# Patient Record
Sex: Male | Born: 1935 | Race: Black or African American | Hispanic: No | Smoking: Former smoker
Health system: Southern US, Community
[De-identification: ages and names within clinical notes are randomized; demographics above are authoritative.]

## PROBLEM LIST (undated history)

## (undated) DIAGNOSIS — I69391 Dysphagia following cerebral infarction: Secondary | ICD-10-CM

## (undated) DIAGNOSIS — I6529 Occlusion and stenosis of unspecified carotid artery: Secondary | ICD-10-CM

## (undated) DIAGNOSIS — G459 Transient cerebral ischemic attack, unspecified: Secondary | ICD-10-CM

## (undated) DIAGNOSIS — M4800 Spinal stenosis, site unspecified: Secondary | ICD-10-CM

## (undated) DIAGNOSIS — M1990 Unspecified osteoarthritis, unspecified site: Secondary | ICD-10-CM

## (undated) DIAGNOSIS — I509 Heart failure, unspecified: Secondary | ICD-10-CM

## (undated) DIAGNOSIS — I739 Peripheral vascular disease, unspecified: Secondary | ICD-10-CM

## (undated) DIAGNOSIS — G40909 Epilepsy, unspecified, not intractable, without status epilepticus: Secondary | ICD-10-CM

## (undated) DIAGNOSIS — I10 Essential (primary) hypertension: Secondary | ICD-10-CM

## (undated) DIAGNOSIS — I639 Cerebral infarction, unspecified: Secondary | ICD-10-CM

## (undated) DIAGNOSIS — E785 Hyperlipidemia, unspecified: Secondary | ICD-10-CM

## (undated) DIAGNOSIS — Z950 Presence of cardiac pacemaker: Secondary | ICD-10-CM

## (undated) DIAGNOSIS — I2510 Atherosclerotic heart disease of native coronary artery without angina pectoris: Secondary | ICD-10-CM

## (undated) HISTORY — PX: INSERT / REPLACE / REMOVE PACEMAKER: SUR710

## (undated) HISTORY — DX: Spinal stenosis, site unspecified: M48.00

## (undated) HISTORY — DX: Unspecified osteoarthritis, unspecified site: M19.90

## (undated) HISTORY — DX: Peripheral vascular disease, unspecified: I73.9

## (undated) HISTORY — DX: Cerebral infarction, unspecified: I63.9

## (undated) HISTORY — DX: Transient cerebral ischemic attack, unspecified: G45.9

## (undated) HISTORY — DX: Occlusion and stenosis of unspecified carotid artery: I65.29

## (undated) HISTORY — DX: Epilepsy, unspecified, not intractable, without status epilepticus: G40.909

## (undated) HISTORY — PX: PACEMAKER PLACEMENT: SHX43

## (undated) HISTORY — DX: Hyperlipidemia, unspecified: E78.5

---

## 2007-09-11 ENCOUNTER — Ambulatory Visit (HOSPITAL_COMMUNITY): Admission: RE | Admit: 2007-09-11 | Discharge: 2007-09-11 | Payer: Self-pay | Admitting: Cardiology

## 2010-06-02 ENCOUNTER — Inpatient Hospital Stay (HOSPITAL_COMMUNITY): Admission: AD | Admit: 2010-06-02 | Discharge: 2010-06-07 | Payer: Self-pay | Admitting: Cardiology

## 2010-06-03 ENCOUNTER — Encounter (INDEPENDENT_AMBULATORY_CARE_PROVIDER_SITE_OTHER): Payer: Self-pay | Admitting: Cardiology

## 2011-01-31 ENCOUNTER — Other Ambulatory Visit: Payer: Self-pay | Admitting: Family Medicine

## 2011-03-13 LAB — CARDIAC PANEL(CRET KIN+CKTOT+MB+TROPI)
CK, MB: 2.7 ng/mL (ref 0.3–4.0)
CK, MB: 3.4 ng/mL (ref 0.3–4.0)
CK, MB: 3.4 ng/mL (ref 0.3–4.0)
CK, MB: 3.4 ng/mL (ref 0.3–4.0)
Relative Index: 1.9 (ref 0.0–2.5)
Relative Index: 2 (ref 0.0–2.5)
Relative Index: 2.1 (ref 0.0–2.5)
Relative Index: 2.3 (ref 0.0–2.5)
Total CK: 128 U/L (ref 7–232)
Total CK: 142 U/L (ref 7–232)
Total CK: 167 U/L (ref 7–232)
Total CK: 178 U/L (ref 7–232)
Total CK: 189 U/L (ref 7–232)
Troponin I: 0.01 ng/mL (ref 0.00–0.06)
Troponin I: 0.02 ng/mL (ref 0.00–0.06)

## 2011-03-13 LAB — BRAIN NATRIURETIC PEPTIDE
Pro B Natriuretic peptide (BNP): 317 pg/mL — ABNORMAL HIGH (ref 0.0–100.0)
Pro B Natriuretic peptide (BNP): 681 pg/mL — ABNORMAL HIGH (ref 0.0–100.0)

## 2011-03-13 LAB — BASIC METABOLIC PANEL
BUN: 16 mg/dL (ref 6–23)
BUN: 18 mg/dL (ref 6–23)
BUN: 9 mg/dL (ref 6–23)
Calcium: 8.5 mg/dL (ref 8.4–10.5)
Calcium: 8.5 mg/dL (ref 8.4–10.5)
Calcium: 8.6 mg/dL (ref 8.4–10.5)
Chloride: 102 mEq/L (ref 96–112)
Chloride: 106 mEq/L (ref 96–112)
Creatinine, Ser: 1.11 mg/dL (ref 0.4–1.5)
Creatinine, Ser: 1.13 mg/dL (ref 0.4–1.5)
Creatinine, Ser: 1.34 mg/dL (ref 0.4–1.5)
Creatinine, Ser: 1.35 mg/dL (ref 0.4–1.5)
GFR calc Af Amer: >60 mL/min (ref >60)
GFR calc Af Amer: >60 mL/min (ref >60)
GFR calc non Af Amer: >60 mL/min (ref >60)
GFR calc non Af Amer: >60 mL/min (ref >60)
Glucose, Bld: 102 mg/dL — ABNORMAL HIGH (ref 70–99)
Glucose, Bld: 108 mg/dL — ABNORMAL HIGH (ref 70–99)
Glucose, Bld: 88 mg/dL (ref 70–99)
Potassium: 3.6 mEq/L (ref 3.5–5.1)
Potassium: 4.1 mEq/L (ref 3.5–5.1)
Potassium: 4.1 mEq/L (ref 3.5–5.1)
Sodium: 141 mEq/L (ref 135–145)
Sodium: 142 mEq/L (ref 135–145)

## 2011-03-13 LAB — GLUCOSE, CAPILLARY
Glucose-Capillary: 111 mg/dL — ABNORMAL HIGH (ref 70–99)
Glucose-Capillary: 120 mg/dL — ABNORMAL HIGH (ref 70–99)
Glucose-Capillary: 138 mg/dL — ABNORMAL HIGH (ref 70–99)
Glucose-Capillary: 166 mg/dL — ABNORMAL HIGH (ref 70–99)
Glucose-Capillary: 170 mg/dL — ABNORMAL HIGH (ref 70–99)
Glucose-Capillary: 187 mg/dL — ABNORMAL HIGH (ref 70–99)
Glucose-Capillary: 192 mg/dL — ABNORMAL HIGH (ref 70–99)
Glucose-Capillary: 193 mg/dL — ABNORMAL HIGH (ref 70–99)
Glucose-Capillary: 203 mg/dL — ABNORMAL HIGH (ref 70–99)
Glucose-Capillary: 241 mg/dL — ABNORMAL HIGH (ref 70–99)
Glucose-Capillary: 71 mg/dL (ref 70–99)
Glucose-Capillary: 97 mg/dL (ref 70–99)

## 2011-03-13 LAB — CBC
HCT: 34.2 % — ABNORMAL LOW (ref 39.0–52.0)
HCT: 38.7 % — ABNORMAL LOW (ref 39.0–52.0)
Hemoglobin: 11.1 g/dL — ABNORMAL LOW (ref 13.0–17.0)
Hemoglobin: 12.4 g/dL — ABNORMAL LOW (ref 13.0–17.0)
MCV: 90.9 fL (ref 78.0–100.0)
RBC: 3.74 MIL/uL — ABNORMAL LOW (ref 4.22–5.81)
RBC: 4.25 MIL/uL (ref 4.22–5.81)
RDW: 14.2 % (ref 11.5–15.5)
WBC: 6.1 10*3/uL (ref 4.0–10.5)

## 2011-03-13 LAB — COMPREHENSIVE METABOLIC PANEL
Albumin: 3.3 g/dL — ABNORMAL LOW (ref 3.5–5.2)
Alkaline Phosphatase: 62 U/L (ref 39–117)
Chloride: 108 mEq/L (ref 96–112)
GFR calc Af Amer: >60 mL/min (ref >60)
Sodium: 144 mEq/L (ref 135–145)

## 2011-03-13 LAB — APTT: aPTT: 32 seconds (ref 24–37)

## 2011-03-13 LAB — PROTIME-INR: INR: 1.05 (ref 0.00–1.49)

## 2011-05-09 NOTE — Op Note (Signed)
NAMEDEMPSEY, AHONEN            ACCOUNT NO.:  [id number]   MEDICAL RECORD NO.:  [id number]          PATIENT TYPE:  OIB   LOCATION:  2899                         FACILITY:  [facility]   PHYSICIAN:  Francisca December, M.D.  DATE OF BIRTH:  [birth year]   DATE OF PROCEDURE:  09/11/2007  DATE OF DISCHARGE:                               OPERATIVE REPORT   PROCEDURES PERFORMED:  1. Explant old pacing generator.  2. Implant new dual-chamber permanent transvenous pacemaker.   INDICATIONS:  Mr. [name] is a 77 year old man who is about 3  years status post PTVT for complete heart block.  He has no underlying  rhythm.  His battery has been depleted early because of multiple  functions activated.  His threshold via the device are not on  reasonable.  He is brought to the catheterization laboratory at this  time for battery replacement.  We will use a larger battery device to  improve subsequent longevity.   PROCEDURE NOTE:  The patient was brought to cardiac catheterization  laboratory in the fasting state.  The left prepectoral region was  prepped and draped in the usual sterile fashion.  Local anesthesia was  obtained with infiltration of 1% lidocaine with epinephrine throughout  the left prepectoral pacemaker insert region.  A 7- to 8-cm incision was  then made over the previous incision site, and this was carried down by  sharp dissection and power electrocautery until the pacemaker capsule  was exposed.  The capsule was then incised, and the device was delivered  from the pocket without extensive difficulty.  The leads were then  detached from the pacing generator and the ventricular lead quickly  attached to the external pacer/analyzer.  No R waves were available for  sensing.  Thresholds were obtained as well as impedances.  The patient  was left on VVI backup pacing at 40, and the atrial lead was  interrogated via the analyzer.  The very low-amplitude P-waves were seen  in the  0.25-0.3 millivolt range.  This made threshold testing very  difficult via the analyzer as the device could not sense the P-waves as  the pacing voltage was decreased.  We had to use capture P-wave  morphology.  It did appear that the threshold via the analyzer using  this technique was about 1.5 mV.  The new pacing generator was then  placed on the field and the lead detached as appropriate.  Each lead was  tightened into place and tested for security.  Since the device was a  1/3 larger than his old pacemaker, extensive pocket enlargement was  required, again using sharp dissection and low-power electrocautery.  Finally, after ascertaining the pocket was enlarged adequately, it was  copiously irrigated with 1% kanamycin solution.  The device was then  placed in the pocket and fluoroscopy performed to ensure adequate lead  placement and no kinking.  The pocket was then closed using 2-0 Vicryl  in running fashion for the subcutaneous layer.  The skin was  approximated using 4-0 Vicryl in a running subcuticular fashion.  Steri-  Strips and sterile dressing were applied, and  the patient was  transported to the recovery area in stable condition.   EQUIPMENT DATA:  The explanted device with a [employer] Jude model number 5370,  serial number [id number] at [employer].  Date of implant July 19, 2004.  The new  implanted device was a [employer] Jude Zephyr XL DR model number [id number], serial  number [id number].   PACING DATA:  The atrial lead as mentioned detected a sporadic 0.2-0.3  mV P-wave.  The pacing threshold was 1.5 volts at 0.5 milliseconds pulse  width.  The impedance was 479 ohms resulting in a current at capture  threshold of 3.0 MA.   The ventricular lead had a pacing threshold of 1.2 volts at 0.5  milliseconds in a bipolar configuration.  The impedance was 435 ohms  resulting in a current at capture threshold of 3.0 MA.  In a unipolar  configuration, the pacing threshold was 0.8 volts at 0.5 milliseconds   pulse width.  The impedance was 364 ohms resulting in a current at  capture threshold of 3.4 MA.      Francisca December, M.D.  Electronically Signed     JHE/MEDQ  D:  09/11/2007  T:  09/11/2007  Job:  [id number]

## 2011-07-12 ENCOUNTER — Inpatient Hospital Stay (HOSPITAL_COMMUNITY)
Admission: AD | Admit: 2011-07-12 | Discharge: 2011-07-24 | DRG: 292 | Disposition: A | Discharge status: Skilled Nursing Facility | Payer: Medicare Other | Source: Ambulatory Visit | Attending: Cardiology | Admitting: Cardiology

## 2011-07-12 ENCOUNTER — Inpatient Hospital Stay (HOSPITAL_COMMUNITY): Payer: Medicare Other

## 2011-07-12 DIAGNOSIS — I5023 Acute on chronic systolic (congestive) heart failure: Principal | ICD-10-CM | POA: Diagnosis present

## 2011-07-12 DIAGNOSIS — I059 Rheumatic mitral valve disease, unspecified: Secondary | ICD-10-CM | POA: Diagnosis present

## 2011-07-12 DIAGNOSIS — Z794 Long term (current) use of insulin: Secondary | ICD-10-CM

## 2011-07-12 DIAGNOSIS — I472 Ventricular tachycardia, unspecified: Secondary | ICD-10-CM | POA: Diagnosis not present

## 2011-07-12 DIAGNOSIS — I428 Other cardiomyopathies: Secondary | ICD-10-CM | POA: Diagnosis present

## 2011-07-12 DIAGNOSIS — Z7982 Long term (current) use of aspirin: Secondary | ICD-10-CM

## 2011-07-12 DIAGNOSIS — Z8673 Personal history of transient ischemic attack (TIA), and cerebral infarction without residual deficits: Secondary | ICD-10-CM

## 2011-07-12 DIAGNOSIS — Z79899 Other long term (current) drug therapy: Secondary | ICD-10-CM

## 2011-07-12 DIAGNOSIS — E1165 Type 2 diabetes mellitus with hyperglycemia: Secondary | ICD-10-CM | POA: Diagnosis not present

## 2011-07-12 DIAGNOSIS — E7800 Pure hypercholesterolemia, unspecified: Secondary | ICD-10-CM | POA: Diagnosis present

## 2011-07-12 DIAGNOSIS — R5381 Other malaise: Secondary | ICD-10-CM | POA: Diagnosis present

## 2011-07-12 DIAGNOSIS — I4729 Other ventricular tachycardia: Secondary | ICD-10-CM | POA: Diagnosis not present

## 2011-07-12 DIAGNOSIS — Z950 Presence of cardiac pacemaker: Secondary | ICD-10-CM

## 2011-07-12 DIAGNOSIS — A498 Other bacterial infections of unspecified site: Secondary | ICD-10-CM | POA: Diagnosis not present

## 2011-07-12 DIAGNOSIS — M4800 Spinal stenosis, site unspecified: Secondary | ICD-10-CM | POA: Diagnosis present

## 2011-07-12 DIAGNOSIS — E876 Hypokalemia: Secondary | ICD-10-CM | POA: Diagnosis not present

## 2011-07-12 DIAGNOSIS — E785 Hyperlipidemia, unspecified: Secondary | ICD-10-CM | POA: Diagnosis present

## 2011-07-12 DIAGNOSIS — N390 Urinary tract infection, site not specified: Secondary | ICD-10-CM | POA: Diagnosis not present

## 2011-07-12 DIAGNOSIS — I10 Essential (primary) hypertension: Secondary | ICD-10-CM | POA: Diagnosis present

## 2011-07-12 DIAGNOSIS — E873 Alkalosis: Secondary | ICD-10-CM | POA: Diagnosis not present

## 2011-07-12 DIAGNOSIS — IMO0002 Reserved for concepts with insufficient information to code with codable children: Secondary | ICD-10-CM | POA: Diagnosis not present

## 2011-07-12 LAB — APTT: aPTT: 34 seconds (ref 24–37)

## 2011-07-12 LAB — COMPREHENSIVE METABOLIC PANEL
AST: 42 U/L — ABNORMAL HIGH (ref 0–37)
Albumin: 3.2 g/dL — ABNORMAL LOW (ref 3.5–5.2)
Alkaline Phosphatase: 85 U/L (ref 39–117)
BUN: 17 mg/dL (ref 6–23)
CO2: 31 mEq/L (ref 19–32)
Chloride: 106 mEq/L (ref 96–112)
GFR calc non Af Amer: >60 mL/min (ref >60)
Potassium: 4.1 mEq/L (ref 3.5–5.1)
Total Bilirubin: 0.7 mg/dL (ref 0.3–1.2)

## 2011-07-12 LAB — PRO B NATRIURETIC PEPTIDE: Pro B Natriuretic peptide (BNP): 18152 pg/mL — ABNORMAL HIGH (ref 0–450)

## 2011-07-12 LAB — DIFFERENTIAL
Basophils Absolute: 0 10*3/uL (ref 0.0–0.1)
Eosinophils Absolute: 0.1 10*3/uL (ref 0.0–0.7)
Eosinophils Relative: 2 % (ref 0–5)
Lymphocytes Relative: 26 % (ref 12–46)
Lymphs Abs: 1.6 10*3/uL (ref 0.7–4.0)
Monocytes Absolute: 0.6 10*3/uL (ref 0.1–1.0)

## 2011-07-12 LAB — CBC
HCT: 41.2 % (ref 39.0–52.0)
MCHC: 33 g/dL (ref 30.0–36.0)
MCV: 87.7 fL (ref 78.0–100.0)
Platelets: 201 10*3/uL (ref 150–400)
RDW: 15.2 % (ref 11.5–15.5)
WBC: 6.1 10*3/uL (ref 4.0–10.5)

## 2011-07-13 LAB — BASIC METABOLIC PANEL
BUN: 20 mg/dL (ref 6–23)
BUN: 20 mg/dL (ref 6–23)
CO2: 31 mEq/L (ref 19–32)
Chloride: 105 mEq/L (ref 96–112)
Chloride: 105 mEq/L (ref 96–112)
Creatinine, Ser: 1.11 mg/dL (ref 0.50–1.35)
Glucose, Bld: 128 mg/dL — ABNORMAL HIGH (ref 70–99)
Potassium: 3.7 mEq/L (ref 3.5–5.1)

## 2011-07-13 LAB — GLUCOSE, CAPILLARY
Glucose-Capillary: 136 mg/dL — ABNORMAL HIGH (ref 70–99)
Glucose-Capillary: 125 mg/dL — ABNORMAL HIGH (ref 70–99)

## 2011-07-13 LAB — HEMOGLOBIN A1C: Mean Plasma Glucose: 192 mg/dL — ABNORMAL HIGH (ref <117)

## 2011-07-14 LAB — BASIC METABOLIC PANEL
GFR calc Af Amer: >60 mL/min (ref >60)
GFR calc non Af Amer: >60 mL/min (ref >60)
Potassium: 4.1 mEq/L (ref 3.5–5.1)
Sodium: 142 mEq/L (ref 135–145)

## 2011-07-14 LAB — GLUCOSE, CAPILLARY
Glucose-Capillary: 159 mg/dL — ABNORMAL HIGH (ref 70–99)
Glucose-Capillary: 123 mg/dL — ABNORMAL HIGH (ref 70–99)

## 2011-07-15 DIAGNOSIS — I509 Heart failure, unspecified: Secondary | ICD-10-CM

## 2011-07-15 LAB — BASIC METABOLIC PANEL
BUN: 25 mg/dL — ABNORMAL HIGH (ref 6–23)
Chloride: 97 mEq/L (ref 96–112)
Creatinine, Ser: 1.3 mg/dL (ref 0.50–1.35)
GFR calc Af Amer: >60 mL/min (ref >60)
GFR calc non Af Amer: 53 mL/min — ABNORMAL LOW (ref >60)

## 2011-07-15 LAB — GLUCOSE, CAPILLARY: Glucose-Capillary: 85 mg/dL (ref 70–99)

## 2011-07-16 LAB — BASIC METABOLIC PANEL
CO2: 39 mEq/L — ABNORMAL HIGH (ref 19–32)
Chloride: 94 mEq/L — ABNORMAL LOW (ref 96–112)
Creatinine, Ser: 1.39 mg/dL — ABNORMAL HIGH (ref 0.50–1.35)

## 2011-07-16 LAB — GLUCOSE, CAPILLARY
Glucose-Capillary: 68 mg/dL — ABNORMAL LOW (ref 70–99)
Glucose-Capillary: 179 mg/dL — ABNORMAL HIGH (ref 70–99)
Glucose-Capillary: 218 mg/dL — ABNORMAL HIGH (ref 70–99)
Glucose-Capillary: 183 mg/dL — ABNORMAL HIGH (ref 70–99)
Glucose-Capillary: 228 mg/dL — ABNORMAL HIGH (ref 70–99)

## 2011-07-17 LAB — BASIC METABOLIC PANEL
BUN: 31 mg/dL — ABNORMAL HIGH (ref 6–23)
Calcium: 9.7 mg/dL (ref 8.4–10.5)
Creatinine, Ser: 1.33 mg/dL (ref 0.50–1.35)
GFR calc non Af Amer: 51 mL/min — ABNORMAL LOW (ref >60)
Glucose, Bld: 170 mg/dL — ABNORMAL HIGH (ref 70–99)

## 2011-07-17 LAB — GLUCOSE, CAPILLARY: Glucose-Capillary: 170 mg/dL — ABNORMAL HIGH (ref 70–99)

## 2011-07-18 LAB — GLUCOSE, CAPILLARY
Glucose-Capillary: 180 mg/dL — ABNORMAL HIGH (ref 70–99)
Glucose-Capillary: 200 mg/dL — ABNORMAL HIGH (ref 70–99)
Glucose-Capillary: 153 mg/dL — ABNORMAL HIGH (ref 70–99)

## 2011-07-18 LAB — BASIC METABOLIC PANEL
CO2: 42 mEq/L (ref 19–32)
Chloride: 94 mEq/L — ABNORMAL LOW (ref 96–112)
GFR calc non Af Amer: 43 mL/min — ABNORMAL LOW (ref >60)
Glucose, Bld: 109 mg/dL — ABNORMAL HIGH (ref 70–99)
Potassium: 4.1 mEq/L (ref 3.5–5.1)
Sodium: 141 mEq/L (ref 135–145)

## 2011-07-19 LAB — BASIC METABOLIC PANEL
Calcium: 9.7 mg/dL (ref 8.4–10.5)
GFR calc Af Amer: 53 mL/min — ABNORMAL LOW (ref >60)
GFR calc non Af Amer: 44 mL/min — ABNORMAL LOW (ref >60)
Glucose, Bld: 88 mg/dL (ref 70–99)
Sodium: 138 mEq/L (ref 135–145)

## 2011-07-19 LAB — GLUCOSE, CAPILLARY: Glucose-Capillary: 176 mg/dL — ABNORMAL HIGH (ref 70–99)

## 2011-07-20 ENCOUNTER — Inpatient Hospital Stay (HOSPITAL_COMMUNITY): Payer: Medicare Other

## 2011-07-20 LAB — BASIC METABOLIC PANEL
CO2: 35 mEq/L — ABNORMAL HIGH (ref 19–32)
Chloride: 95 mEq/L — ABNORMAL LOW (ref 96–112)
Sodium: 139 mEq/L (ref 135–145)

## 2011-07-20 LAB — URINALYSIS, MICROSCOPIC ONLY
Glucose, UA: NEGATIVE mg/dL
Ketones, ur: NEGATIVE mg/dL
Nitrite: NEGATIVE
Specific Gravity, Urine: 1.01 (ref 1.005–1.030)
pH: 8 (ref 5.0–8.0)

## 2011-07-20 LAB — PRO B NATRIURETIC PEPTIDE: Pro B Natriuretic peptide (BNP): 2571 pg/mL — ABNORMAL HIGH (ref 0–450)

## 2011-07-20 LAB — CBC
MCH: 29.5 pg (ref 26.0–34.0)
MCHC: 34.4 g/dL (ref 30.0–36.0)
RDW: 14.5 % (ref 11.5–15.5)

## 2011-07-20 LAB — GLUCOSE, CAPILLARY
Glucose-Capillary: 61 mg/dL — ABNORMAL LOW (ref 70–99)
Glucose-Capillary: 162 mg/dL — ABNORMAL HIGH (ref 70–99)
Glucose-Capillary: 215 mg/dL — ABNORMAL HIGH (ref 70–99)
Glucose-Capillary: 298 mg/dL — ABNORMAL HIGH (ref 70–99)

## 2011-07-21 LAB — BASIC METABOLIC PANEL
BUN: 43 mg/dL — ABNORMAL HIGH (ref 6–23)
Calcium: 9.1 mg/dL (ref 8.4–10.5)
GFR calc Af Amer: 48 mL/min — ABNORMAL LOW (ref >60)
GFR calc non Af Amer: 40 mL/min — ABNORMAL LOW (ref >60)
Glucose, Bld: 111 mg/dL — ABNORMAL HIGH (ref 70–99)
Potassium: 4 mEq/L (ref 3.5–5.1)
Sodium: 141 mEq/L (ref 135–145)

## 2011-07-21 LAB — GLUCOSE, CAPILLARY: Glucose-Capillary: 160 mg/dL — ABNORMAL HIGH (ref 70–99)

## 2011-07-21 LAB — CBC
Hemoglobin: 13.6 g/dL (ref 13.0–17.0)
MCH: 28.9 pg (ref 26.0–34.0)
MCHC: 33.9 g/dL (ref 30.0–36.0)
RDW: 14.4 % (ref 11.5–15.5)

## 2011-07-21 LAB — URINE CULTURE: Colony Count: 25000

## 2011-07-22 LAB — CBC
HCT: 38.9 % — ABNORMAL LOW (ref 39.0–52.0)
Hemoglobin: 13.4 g/dL (ref 13.0–17.0)
MCH: 29.1 pg (ref 26.0–34.0)
MCHC: 34.4 g/dL (ref 30.0–36.0)
MCV: 84.6 fL (ref 78.0–100.0)
Platelets: 223 10*3/uL (ref 150–400)
RBC: 4.6 MIL/uL (ref 4.22–5.81)
RDW: 14.2 % (ref 11.5–15.5)
WBC: 9.4 10*3/uL (ref 4.0–10.5)

## 2011-07-22 LAB — BASIC METABOLIC PANEL
BUN: 43 mg/dL — ABNORMAL HIGH (ref 6–23)
CO2: 30 mEq/L (ref 19–32)
Calcium: 8.8 mg/dL (ref 8.4–10.5)
Chloride: 98 mEq/L (ref 96–112)
Creatinine, Ser: 1.46 mg/dL — ABNORMAL HIGH (ref 0.50–1.35)
GFR calc Af Amer: 56 mL/min — ABNORMAL LOW (ref >60)
GFR calc non Af Amer: 46 mL/min — ABNORMAL LOW (ref >60)
Glucose, Bld: 113 mg/dL — ABNORMAL HIGH (ref 70–99)
Potassium: 3.8 mEq/L (ref 3.5–5.1)
Sodium: 137 mEq/L (ref 135–145)

## 2011-07-22 LAB — GLUCOSE, CAPILLARY
Glucose-Capillary: 105 mg/dL — ABNORMAL HIGH (ref 70–99)
Glucose-Capillary: 183 mg/dL — ABNORMAL HIGH (ref 70–99)
Glucose-Capillary: 84 mg/dL (ref 70–99)

## 2011-07-23 LAB — GLUCOSE, CAPILLARY: Glucose-Capillary: 178 mg/dL — ABNORMAL HIGH (ref 70–99)

## 2011-07-24 LAB — URINE CULTURE
Colony Count: >=100000
Culture  Setup Time: 201207281238

## 2011-07-26 LAB — CULTURE, BLOOD (ROUTINE X 2)
Culture  Setup Time: 201207262138
Culture: NO GROWTH

## 2011-07-27 NOTE — Discharge Summary (Signed)
  [name], [name] NO.:  [id number]  MEDICAL RECORD NO.:  [id number]  LOCATION:  3709                         FACILITY:  [facility]  PHYSICIAN:  Armanda Magic, M.D.     DATE OF BIRTH:  10/29/1927  DATE OF ADMISSION:  07/12/2011 DATE OF DISCHARGE:  07/24/2011                              DISCHARGE SUMMARY   ADDENDUM:  The patient was supposed to be discharged on July 21, 2011, but right before he was to go to the skilled nursing facility he spiked a temperature and his transfer was cancelled.  He had blood cultures checked which have been negative up to today but did have a urine which grew out greater than 10 to the 5th E. coli.  He was started on ciprofloxacin 250 mg b.i.d. for 3 days.  He still has 4 doses left.  He defervesced and has not had any further fevers.  On the day of discharge, he was ambulating and IN stable condition without any complaints.  He will be discharged to skilled nursing facility on the medications stated and his initial discharge summary with the addition of Cipro 250 mg b.i.d. for additional 4 doses.     Armanda Magic, M.D.     TT/MEDQ  D:  07/24/2011  T:  07/24/2011  Job:  [id number]  Electronically Signed by Armanda Magic M.D. on 07/27/2011 08:54:21 AM

## 2011-07-31 NOTE — Discharge Summary (Signed)
[name], [name]:  [id number]  MEDICAL RECORD NO.:  [id number]  LOCATION:  3709                         FACILITY:  [facility]  PHYSICIAN:  Jake Bathe, MD      DATE OF BIRTH:  10-06-1927  DATE OF ADMISSION:  07/12/2011 DATE OF DISCHARGE:  anticipated 07/20/2011                        DISCHARGE SUMMARY - REFERRING   FINAL DIAGNOSES: 1. Acute-on-chronic systolic heart failure. 2. Diabetes. 3. Hyperlipidemia. 4. Hypertension. 5. Prior stroke. 6. Status post [employer] Jude dual chamber pacemaker placed in 2008. 7. Deconditioning. 8. Spinal stenosis. 9. Hemorrhoids.  DISCHARGE MEDICATIONS: 1. Spironolactone 25 mg once a day (this is new). 2. Furosemide 40 mg once a day (he was on 40 mg twice a day, however,     his creatinine after an extensive diuresis has increased and is on     discharge is 1.6 up from 1.5 yesterday).  He may in fact need 40 mg     twice a day in the long run. 3. Aspirin 81 mg a day. 4. Carvedilol 25 mg twice a day (on his prior med list he was taking     50 mg twice a day, this has been reduced to 25 mg twice a day). 5. Lipitor 20 mg once a day. 6. Potassium chloride 20 mEq once a day. 7. Stop Benicar, stop Imdur, stop hydralazine all due to hypotension. 8. Lantus 10 units once a day.  DISCHARGE LABORATORY DATA:  On discharge, Pro BNP was 2571, on admit he was 91478.  Sodium 139, potassium 4.1, BUN 38, creatinine 1.62, chloride 35.  On admit, creatinine 0.97 during massive fluid overload. Hemoglobin A1c 8.3.  LFTs, AST was 42, ALT 37, albumin 3.2 on admit. INR 1.21 on admit.  Chest x-ray showed stable cardiomegaly, persistent bilateral pleural effusions on admit.  Echocardiogram June 03, 2010, EF of 35-40% with mild mitral regurgitation.  Telemetry demonstrates paced rhythm.  Early on admission, he had a 91 ventricular tachycardia at approximately 130 beats per minute.  No further recurrences.  Discharge weight 83.6 kg, admission  weight 97.2 kg.  Discharge weight in pounds is 183 pounds, admission weight in pounds was 205 pounds.  BRIEF HOSPITAL COURSE:  An 77 year old male with acute-on-chronic systolic heart failure EF in the 35-40% range was seen in clinic on day of admission with gross edema 4+ bilateral lower extremities, shortness of breath, not being able to take good care of himself at home. Questionable medical compliance at home.  His brother accompanies him to his appointments and does not know if he is fully taking his medications.  His daughter is also critical in his care as well.  He was in his bed for most of the week prior to the appointment not feeling well.  He was admitted and aggressively diuresed first with IV Lasix 80 mg q. 8 hours, then this was transitioned to 80 mg p.o. b.i.d. and successful diuresis took place.  See discharge weight above.  His lower extremity edema has resolved.  Lungs are clear to auscultation bilaterally.  Heart is bradycardic, regular rhythm with occasional ectopy.  He is alert and oriented x3.  Three days prior to discharge, he did state that he had  some heel pain, left heel tender to palpation and ankle after the diuresis, however, this has resolved or improved.  No signs of infection.  No fevers.  He is ambulating with assistance.  Case management has spoken with him and his daughter and he will be placed at a skilled nursing facility.  In regards to his diabetic care, he did have one episode of hypoglycemia while on Lantus 32 units.  This was brought back to 15 units.  He did require some sliding scale so on discharge, I will send him out on 20 units of insulin.  Hemoglobin A1c was 8.3 which was elevated overall.  This will have to be closely monitored.  [name] is his primary physician.  He will continue to monitor this.  His pacemaker was functioning well.  DISCHARGE FOLLOWUP:  He has followup with our clinic in 1 week with [name].  At that  time, a basic metabolic profile will be performed stat in order to check his creatinine.  I have dropped back his Lasix to 40 mg once a day.  He may actually need 40 mg twice a day for maintenance therapy and I want to have daily weights and not to become edematous.  Discharge time in total was 35 minutes.  Med reconciliation, lab work teaching was performed.  As of note, his blood sugar was 61 earlier this morning.  This was on 15 units of Lantus, however, over the past 2 days he has received sliding scale insulin at 3 units on two separate occasions.  I am going to discharge him on 20 units of Lantus, however, he may very well not need this amount in fact after further review, I will go ahead and decrease the Lantus dose to 10 units to ensure that he does not become hypoglycemic.  Further adjustments may need to be made.  ADDENDUM: Just prior to D/C to SNF he had one bout of emesis, complained of body "aches" and his temperature was noted to be 101.8. Because of this, cultures and CXR were preformed and D/C on this day was cancelled.     Jake Bathe, MD     MCS/MEDQ  D:  07/20/2011  T:  07/20/2011  Job:  [id number]  cc:   [name], M.D.  Electronically Signed by Donato Schultz MD on 07/31/2011 07:26:32 AM

## 2011-09-20 ENCOUNTER — Emergency Department (HOSPITAL_COMMUNITY): Payer: Medicare Other

## 2011-09-20 ENCOUNTER — Inpatient Hospital Stay (HOSPITAL_COMMUNITY)
Admission: EM | Admit: 2011-09-20 | Discharge: 2011-09-26 | DRG: 871 | Disposition: A | Discharge status: Skilled Nursing Facility | Payer: Medicare Other | Attending: Internal Medicine | Admitting: Internal Medicine

## 2011-09-20 ENCOUNTER — Encounter (HOSPITAL_COMMUNITY): Payer: Self-pay | Admitting: Radiology

## 2011-09-20 DIAGNOSIS — F0390 Unspecified dementia without behavioral disturbance: Secondary | ICD-10-CM | POA: Diagnosis present

## 2011-09-20 DIAGNOSIS — E876 Hypokalemia: Secondary | ICD-10-CM | POA: Diagnosis not present

## 2011-09-20 DIAGNOSIS — E785 Hyperlipidemia, unspecified: Secondary | ICD-10-CM | POA: Diagnosis present

## 2011-09-20 DIAGNOSIS — Z794 Long term (current) use of insulin: Secondary | ICD-10-CM

## 2011-09-20 DIAGNOSIS — R5381 Other malaise: Secondary | ICD-10-CM | POA: Diagnosis present

## 2011-09-20 DIAGNOSIS — G9349 Other encephalopathy: Secondary | ICD-10-CM | POA: Diagnosis present

## 2011-09-20 DIAGNOSIS — Z950 Presence of cardiac pacemaker: Secondary | ICD-10-CM

## 2011-09-20 DIAGNOSIS — A499 Bacterial infection, unspecified: Secondary | ICD-10-CM | POA: Diagnosis present

## 2011-09-20 DIAGNOSIS — I428 Other cardiomyopathies: Secondary | ICD-10-CM | POA: Diagnosis present

## 2011-09-20 DIAGNOSIS — Z7982 Long term (current) use of aspirin: Secondary | ICD-10-CM

## 2011-09-20 DIAGNOSIS — IMO0001 Reserved for inherently not codable concepts without codable children: Secondary | ICD-10-CM | POA: Diagnosis present

## 2011-09-20 DIAGNOSIS — N179 Acute kidney failure, unspecified: Secondary | ICD-10-CM | POA: Diagnosis present

## 2011-09-20 DIAGNOSIS — N410 Acute prostatitis: Secondary | ICD-10-CM | POA: Diagnosis present

## 2011-09-20 DIAGNOSIS — Z79899 Other long term (current) drug therapy: Secondary | ICD-10-CM

## 2011-09-20 DIAGNOSIS — I509 Heart failure, unspecified: Secondary | ICD-10-CM | POA: Diagnosis present

## 2011-09-20 DIAGNOSIS — N139 Obstructive and reflux uropathy, unspecified: Secondary | ICD-10-CM | POA: Diagnosis present

## 2011-09-20 DIAGNOSIS — D72829 Elevated white blood cell count, unspecified: Secondary | ICD-10-CM | POA: Diagnosis present

## 2011-09-20 DIAGNOSIS — A419 Sepsis, unspecified organism: Secondary | ICD-10-CM

## 2011-09-20 DIAGNOSIS — I5023 Acute on chronic systolic (congestive) heart failure: Secondary | ICD-10-CM | POA: Diagnosis present

## 2011-09-20 DIAGNOSIS — Z8673 Personal history of transient ischemic attack (TIA), and cerebral infarction without residual deficits: Secondary | ICD-10-CM

## 2011-09-20 DIAGNOSIS — L97409 Non-pressure chronic ulcer of unspecified heel and midfoot with unspecified severity: Secondary | ICD-10-CM | POA: Diagnosis present

## 2011-09-20 DIAGNOSIS — N400 Enlarged prostate without lower urinary tract symptoms: Secondary | ICD-10-CM | POA: Diagnosis present

## 2011-09-20 DIAGNOSIS — N3090 Cystitis, unspecified without hematuria: Secondary | ICD-10-CM | POA: Diagnosis present

## 2011-09-20 DIAGNOSIS — I10 Essential (primary) hypertension: Secondary | ICD-10-CM | POA: Diagnosis present

## 2011-09-20 DIAGNOSIS — N3000 Acute cystitis without hematuria: Secondary | ICD-10-CM

## 2011-09-20 DIAGNOSIS — B9689 Other specified bacterial agents as the cause of diseases classified elsewhere: Secondary | ICD-10-CM | POA: Diagnosis present

## 2011-09-20 DIAGNOSIS — A498 Other bacterial infections of unspecified site: Secondary | ICD-10-CM | POA: Diagnosis present

## 2011-09-20 DIAGNOSIS — R6520 Severe sepsis without septic shock: Secondary | ICD-10-CM | POA: Diagnosis present

## 2011-09-20 DIAGNOSIS — D649 Anemia, unspecified: Secondary | ICD-10-CM | POA: Diagnosis present

## 2011-09-20 HISTORY — DX: Atherosclerotic heart disease of native coronary artery without angina pectoris: I25.10

## 2011-09-20 HISTORY — DX: Heart failure, unspecified: I50.9

## 2011-09-20 HISTORY — DX: Essential (primary) hypertension: I10

## 2011-09-20 HISTORY — DX: Presence of cardiac pacemaker: Z95.0

## 2011-09-20 LAB — URINE MICROSCOPIC-ADD ON

## 2011-09-20 LAB — COMPREHENSIVE METABOLIC PANEL
ALT: 22 U/L (ref 0–53)
AST: 31 U/L (ref 0–37)
Albumin: 3.1 g/dL — ABNORMAL LOW (ref 3.5–5.2)
Alkaline Phosphatase: 77 U/L (ref 39–117)
Chloride: 98 mEq/L (ref 96–112)
Potassium: 3.8 mEq/L (ref 3.5–5.1)
Sodium: 139 mEq/L (ref 135–145)
Total Bilirubin: 0.7 mg/dL (ref 0.3–1.2)
Total Protein: 7.6 g/dL (ref 6.0–8.3)

## 2011-09-20 LAB — URINALYSIS, ROUTINE W REFLEX MICROSCOPIC
Glucose, UA: NEGATIVE mg/dL
Ketones, ur: 15 mg/dL — AB
Nitrite: POSITIVE — AB
Specific Gravity, Urine: 1.015 (ref 1.005–1.030)
pH: 6 (ref 5.0–8.0)

## 2011-09-20 LAB — GLUCOSE, CAPILLARY: Glucose-Capillary: 171 mg/dL — ABNORMAL HIGH (ref 70–99)

## 2011-09-20 LAB — DIFFERENTIAL
Basophils Absolute: 0 10*3/uL (ref 0.0–0.1)
Eosinophils Relative: 0 % (ref 0–5)
Lymphocytes Relative: 3 % — ABNORMAL LOW (ref 12–46)
Lymphs Abs: 0.8 10*3/uL (ref 0.7–4.0)
Monocytes Relative: 5 % (ref 3–12)
Neutro Abs: 24.8 10*3/uL — ABNORMAL HIGH (ref 1.7–7.7)

## 2011-09-20 LAB — BLOOD GAS, ARTERIAL
Bicarbonate: 24.5 mEq/L — ABNORMAL HIGH (ref 20.0–24.0)
O2 Saturation: 98.5 %
Patient temperature: 98.6
TCO2: 25.5 mmol/L (ref 0–100)

## 2011-09-20 LAB — CBC
HCT: 37 % — ABNORMAL LOW (ref 39.0–52.0)
Hemoglobin: 12.5 g/dL — ABNORMAL LOW (ref 13.0–17.0)
MCHC: 33.8 g/dL (ref 30.0–36.0)
RBC: 4.32 MIL/uL (ref 4.22–5.81)

## 2011-09-20 LAB — MAGNESIUM: Magnesium: 1.9 mg/dL (ref 1.5–2.5)

## 2011-09-20 LAB — APTT
aPTT: 37 seconds (ref 24–37)
aPTT: 39 seconds — ABNORMAL HIGH (ref 24–37)

## 2011-09-20 LAB — TSH: TSH: 1.962 u[IU]/mL (ref 0.350–4.500)

## 2011-09-20 LAB — CARDIAC PANEL(CRET KIN+CKTOT+MB+TROPI)
CK, MB: 11.3 ng/mL (ref 0.3–4.0)
Relative Index: 3.4 — ABNORMAL HIGH (ref 0.0–2.5)
Total CK: 368 U/L — ABNORMAL HIGH (ref 7–232)
Troponin I: 0.57 ng/mL (ref <0.30)

## 2011-09-20 LAB — BASIC METABOLIC PANEL
CO2: 29 mEq/L (ref 19–32)
Chloride: 100 mEq/L (ref 96–112)
Creatinine, Ser: 2.05 mg/dL — ABNORMAL HIGH (ref 0.50–1.35)

## 2011-09-20 LAB — LIPID PANEL
Cholesterol: 125 mg/dL (ref 0–200)
HDL: 53 mg/dL (ref >39)
Triglycerides: 122 mg/dL (ref <150)
VLDL: 24 mg/dL (ref 0–40)

## 2011-09-20 LAB — PRO B NATRIURETIC PEPTIDE: Pro B Natriuretic peptide (BNP): >70000 pg/mL — ABNORMAL HIGH (ref 0–450)

## 2011-09-20 LAB — POCT I-STAT TROPONIN I: Troponin i, poc: 0.03 ng/mL (ref 0.00–0.08)

## 2011-09-20 LAB — PROCALCITONIN: Procalcitonin: 149.71 ng/mL

## 2011-09-20 LAB — CK TOTAL AND CKMB (NOT AT ARMC): Total CK: 290 U/L — ABNORMAL HIGH (ref 7–232)

## 2011-09-21 DIAGNOSIS — I509 Heart failure, unspecified: Secondary | ICD-10-CM

## 2011-09-21 DIAGNOSIS — A419 Sepsis, unspecified organism: Secondary | ICD-10-CM

## 2011-09-21 DIAGNOSIS — N3000 Acute cystitis without hematuria: Secondary | ICD-10-CM

## 2011-09-21 LAB — CBC
HCT: 31.7 % — ABNORMAL LOW (ref 39.0–52.0)
Hemoglobin: 10.5 g/dL — ABNORMAL LOW (ref 13.0–17.0)
MCH: 28 pg (ref 26.0–34.0)
MCHC: 33.1 g/dL (ref 30.0–36.0)
MCV: 84.5 fL (ref 78.0–100.0)

## 2011-09-21 LAB — TYPE AND SCREEN
ABO/RH(D): A POS
Antibody Screen: NEGATIVE

## 2011-09-21 LAB — BASIC METABOLIC PANEL
BUN: 36 mg/dL — ABNORMAL HIGH (ref 6–23)
CO2: 29 mEq/L (ref 19–32)
Chloride: 103 mEq/L (ref 96–112)
Creatinine, Ser: 2.2 mg/dL — ABNORMAL HIGH (ref 0.50–1.35)
Glucose, Bld: 133 mg/dL — ABNORMAL HIGH (ref 70–99)

## 2011-09-21 LAB — PRO B NATRIURETIC PEPTIDE
Pro B Natriuretic peptide (BNP): >70000 pg/mL — ABNORMAL HIGH (ref 0–450)
Pro B Natriuretic peptide (BNP): >70000 pg/mL — ABNORMAL HIGH (ref 0–450)

## 2011-09-21 LAB — CARDIAC PANEL(CRET KIN+CKTOT+MB+TROPI)
Relative Index: 2.3 (ref 0.0–2.5)
Troponin I: 2.04 ng/mL (ref <0.30)

## 2011-09-21 LAB — GLUCOSE, CAPILLARY: Glucose-Capillary: 171 mg/dL — ABNORMAL HIGH (ref 70–99)

## 2011-09-22 ENCOUNTER — Inpatient Hospital Stay (HOSPITAL_COMMUNITY): Payer: Medicare Other

## 2011-09-22 LAB — GLUCOSE, CAPILLARY
Glucose-Capillary: 152 mg/dL — ABNORMAL HIGH (ref 70–99)
Glucose-Capillary: 115 mg/dL — ABNORMAL HIGH (ref 70–99)
Glucose-Capillary: 98 mg/dL (ref 70–99)
Glucose-Capillary: 167 mg/dL — ABNORMAL HIGH (ref 70–99)
Glucose-Capillary: 189 mg/dL — ABNORMAL HIGH (ref 70–99)

## 2011-09-22 LAB — CORTISOL: Cortisol, Plasma: 20.3 ug/dL

## 2011-09-22 LAB — CARDIAC PANEL(CRET KIN+CKTOT+MB+TROPI)
CK, MB: 3.8 ng/mL (ref 0.3–4.0)
Relative Index: 2 (ref 0.0–2.5)
Total CK: 158 U/L (ref 7–232)
Troponin I: 3.09 ng/mL (ref <0.30)

## 2011-09-22 LAB — BASIC METABOLIC PANEL
BUN: 44 mg/dL — ABNORMAL HIGH (ref 6–23)
CO2: 30 mEq/L (ref 19–32)
Calcium: 8.3 mg/dL — ABNORMAL LOW (ref 8.4–10.5)
Chloride: 101 mEq/L (ref 96–112)
Creatinine, Ser: 2.22 mg/dL — ABNORMAL HIGH (ref 0.50–1.35)
Glucose, Bld: 105 mg/dL — ABNORMAL HIGH (ref 70–99)

## 2011-09-22 LAB — CBC
Hemoglobin: 9.9 g/dL — ABNORMAL LOW (ref 13.0–17.0)
MCH: 28 pg (ref 26.0–34.0)
MCHC: 33.3 g/dL (ref 30.0–36.0)
MCV: 83.9 fL (ref 78.0–100.0)
RBC: 3.54 MIL/uL — ABNORMAL LOW (ref 4.22–5.81)

## 2011-09-22 LAB — URINE CULTURE: Culture  Setup Time: 201209260959

## 2011-09-22 LAB — LACTIC ACID, PLASMA: Lactic Acid, Venous: 1.4 mmol/L (ref 0.5–2.2)

## 2011-09-23 LAB — BASIC METABOLIC PANEL
BUN: 40 mg/dL — ABNORMAL HIGH (ref 6–23)
Chloride: 101 mEq/L (ref 96–112)
Creatinine, Ser: 1.57 mg/dL — ABNORMAL HIGH (ref 0.50–1.35)
GFR calc Af Amer: 51 mL/min — ABNORMAL LOW (ref >60)
GFR calc non Af Amer: 43 mL/min — ABNORMAL LOW (ref >60)
Potassium: 3.3 mEq/L — ABNORMAL LOW (ref 3.5–5.1)

## 2011-09-23 LAB — GLUCOSE, CAPILLARY
Glucose-Capillary: 118 mg/dL — ABNORMAL HIGH (ref 70–99)
Glucose-Capillary: 105 mg/dL — ABNORMAL HIGH (ref 70–99)
Glucose-Capillary: 174 mg/dL — ABNORMAL HIGH (ref 70–99)
Glucose-Capillary: 145 mg/dL — ABNORMAL HIGH (ref 70–99)

## 2011-09-23 LAB — CBC
HCT: 30.8 % — ABNORMAL LOW (ref 39.0–52.0)
MCHC: 33.8 g/dL (ref 30.0–36.0)
Platelets: 182 10*3/uL (ref 150–400)
RDW: 13.8 % (ref 11.5–15.5)
WBC: 14.8 10*3/uL — ABNORMAL HIGH (ref 4.0–10.5)

## 2011-09-23 LAB — CARDIAC PANEL(CRET KIN+CKTOT+MB+TROPI)
CK, MB: 3.1 ng/mL (ref 0.3–4.0)
Total CK: 119 U/L (ref 7–232)
Troponin I: 2.53 ng/mL (ref <0.30)

## 2011-09-24 LAB — GLUCOSE, CAPILLARY
Glucose-Capillary: 217 mg/dL — ABNORMAL HIGH (ref 70–99)
Glucose-Capillary: 167 mg/dL — ABNORMAL HIGH (ref 70–99)
Glucose-Capillary: 217 mg/dL — ABNORMAL HIGH (ref 70–99)

## 2011-09-24 LAB — BASIC METABOLIC PANEL
BUN: 33 mg/dL — ABNORMAL HIGH (ref 6–23)
CO2: 29 mEq/L (ref 19–32)
Chloride: 104 mEq/L (ref 96–112)
Creatinine, Ser: 1.31 mg/dL (ref 0.50–1.35)
Potassium: 4 mEq/L (ref 3.5–5.1)

## 2011-09-24 LAB — CBC
MCHC: 33.1 g/dL (ref 30.0–36.0)
RDW: 13.9 % (ref 11.5–15.5)
WBC: 11.7 10*3/uL — ABNORMAL HIGH (ref 4.0–10.5)

## 2011-09-25 ENCOUNTER — Inpatient Hospital Stay (HOSPITAL_COMMUNITY): Payer: Medicare Other

## 2011-09-25 LAB — BASIC METABOLIC PANEL
BUN: 25 mg/dL — ABNORMAL HIGH (ref 6–23)
Calcium: 8.9 mg/dL (ref 8.4–10.5)
Chloride: 101 mEq/L (ref 96–112)
Creatinine, Ser: 1.16 mg/dL (ref 0.50–1.35)
GFR calc Af Amer: 66 mL/min — ABNORMAL LOW (ref >90)
GFR calc non Af Amer: 57 mL/min — ABNORMAL LOW (ref >90)

## 2011-09-25 LAB — CBC
MCHC: 32.5 g/dL (ref 30.0–36.0)
MCV: 85.1 fL (ref 78.0–100.0)
Platelets: 230 10*3/uL (ref 150–400)
RDW: 13.8 % (ref 11.5–15.5)
WBC: 12.9 10*3/uL — ABNORMAL HIGH (ref 4.0–10.5)

## 2011-09-25 LAB — GLUCOSE, CAPILLARY
Glucose-Capillary: 147 mg/dL — ABNORMAL HIGH (ref 70–99)
Glucose-Capillary: 194 mg/dL — ABNORMAL HIGH (ref 70–99)

## 2011-09-26 LAB — CBC
HCT: 31.5 % — ABNORMAL LOW (ref 39.0–52.0)
Hemoglobin: 10.4 g/dL — ABNORMAL LOW (ref 13.0–17.0)
MCH: 28.1 pg (ref 26.0–34.0)
MCV: 85.1 fL (ref 78.0–100.0)
RBC: 3.7 MIL/uL — ABNORMAL LOW (ref 4.22–5.81)
WBC: 13.1 10*3/uL — ABNORMAL HIGH (ref 4.0–10.5)

## 2011-09-26 LAB — BASIC METABOLIC PANEL
BUN: 20 mg/dL (ref 6–23)
CO2: 31 mEq/L (ref 19–32)
Chloride: 105 mEq/L (ref 96–112)
Creatinine, Ser: 1.11 mg/dL (ref 0.50–1.35)
GFR calc Af Amer: 69 mL/min — ABNORMAL LOW (ref >90)
Glucose, Bld: 135 mg/dL — ABNORMAL HIGH (ref 70–99)
Potassium: 3.9 mEq/L (ref 3.5–5.1)

## 2011-09-26 NOTE — Consult Note (Signed)
  [name], [name]:  [id number]  MEDICAL RECORD NO.:  [id number]  LOCATION:  2608                         FACILITY:  [facility]  PHYSICIAN:  Corky Crafts, MDDATE OF BIRTH:  [birth year]  DATE OF CONSULTATION:  09/20/2011 DATE OF DISCHARGE:                                CONSULTATION   PRIMARY CARDIOLOGIST:  Jake Bathe, MD  REASON FOR CONSULTATION:  Abnormal troponin.  HISTORY OF PRESENT ILLNESS:  The patient is an 77 year old man with known systolic dysfunction.  He was admitted because of abdominal pain. By CT scanning, he was found to have hemorrhagic cystitis. Routine troponins were checked and they were elevated.  His BNP was also elevated.  We are asked to further evaluate.  The patient reports only some mid abdominal pain.  He is not complaining of chest pain or shortness of breath.  The history that we can get from him is somewhat limited due to his mental status.  Some of the history is per the chart.  ALLERGIES:  No known drug allergies.  MEDICATIONS FROM HIS FACILITY:  Percocet, Lipitor, Lantus, Lasix, Coreg, aspirin.  According to our office chart, he was taking spironolactone as a few months before.  SOCIAL HISTORY:  He lives at [facility].  He has a daughter. He does not smoke.  PAST MEDICAL HISTORY:  Systolic dysfunction, hypertension, diabetes, hyperlipidemia, prior stroke.  FAMILY HISTORY:  Noncontributory.  REVIEW OF SYSTEMS:  Significant only for abdominal pain.  Blood in the urine is also noted.  PHYSICAL EXAMINATION:  VITAL SIGNS:  Blood pressure 146/60, heart rate 74. GENERAL:  He is lethargic. HEAD:  Normocephalic, atraumatic. CARDIOVASCULAR:  Irregularly irregular rhythm. LUNGS:  No wheezing. ABDOMEN:  Mildly tender. EXTREMITIES:  Showed trace edema. SKIN:  No rash.  Lab work shows elevated troponin up to 0.86, hemoglobin A1c is 9.1.  TSH 1.9.  BNP elevated.  EKG shows the paced rhythm.  Chest  x-ray shows pulmonary vascular congestion without frank edema.  ASSESSMENT/PLAN:  A 77 year old with multiple medical issues.  PLAN: 1. Cardiac:  Likely in some degree of heart failure.  We will have to     manage his volume status carefully based on his renal function.     His creatinine is somewhat elevated today. 2. Positive troponin.  I do not think there is an ACS.  I would not     heparinize this patient given his active bleeding from his bladder.     He is not a candidate for invasive treatment at this time, would     likely only manage medically. 3. Hemorrhagic cystitis.  He will be receiving antibiotics.  We will     follow along.     Corky Crafts, MD     JSV/MEDQ  D:  09/20/2011  T:  09/20/2011  Job:  [id number]  Electronically Signed by Lance Muss MD on 09/26/2011 01:05:27 PM

## 2011-09-27 LAB — CULTURE, BLOOD (ROUTINE X 2)
Culture  Setup Time: 201209270150
Culture: NO GROWTH
Culture: NO GROWTH

## 2011-10-09 NOTE — Discharge Summary (Signed)
  NAMEJOBANI, [name]            ACCOUNT NO.:  [id number]  MEDICAL RECORD NO.:  [id number]  LOCATION:  5527                         FACILITY:  [facility]  PHYSICIAN:  Thad Ranger, MD       DATE OF BIRTH:  05/12/1927  DATE OF ADMISSION:  09/20/2011 DATE OF DISCHARGE:  09/26/2011                              DISCHARGE SUMMARY   ADDENDUM:  Near the time of discharge, the patient's Foley catheter was removed approximately 3 hours later, and bladder scan was done and he had 381 mL of retained urine.  Consequently, a Foley catheter was inserted and the patient will be discharged to the skilled nursing facility with Foley catheter in place.  He will need to follow up with Dr. Jerilee Field for appropriate management of the Foley catheter given his acute prostatitis and hemorrhagic cystitis.  Dr. Mena Goes will be the one to determine when the Foley catheter should be discontinued.  The patient was seen in consultation by Wound Care during this hospitalization who recommended that the patient have ankle brachial indices testing done as an outpatient.  Consequently, we would suggest his primary care physician that ABI be ordered as an outpatient to evaluate this patient's lower extremity perfusion.     Stephani Police, PA   ______________________________ Thad Ranger, MD    MLY/MEDQ  D:  09/26/2011  T:  09/26/2011  Job:  [id number]  cc:   Jerilee Field, MD  Electronically Signed by Algis Downs PA on 09/30/2011 08:14:44 PM Electronically Signed by Andres Labrum Johara Lodwick  on 10/09/2011 02:16:55 PM

## 2011-10-09 NOTE — Discharge Summary (Signed)
NAMEJAIMES, ECKERT NO.:  [id number]  MEDICAL RECORD NO.:  [id number]  LOCATION:  5527                         FACILITY:  [facility]  PHYSICIAN:  Thad Ranger, MD       DATE OF BIRTH:  04/26/1927  DATE OF ADMISSION:  09/20/2011 DATE OF DISCHARGE:  09/26/2011                              DISCHARGE SUMMARY   PRIMARY CARE PROVIDER:  [facility]  PRIMARY CARDIOLOGIST:  Jake Bathe, MD  UROLOGIST:  Jerilee Field, MD  DISCHARGE DIAGNOSES: 1. Severe sepsis with urinary tract infection and bacteremia. 2. Hemorrhagic cystitis with gram-negative rods. 3. Acute bacterial prostatitis. 4. Acute on chronic congestive heart failure with an LVEF of 20% to     25%. 5. Acute renal failure. 6. Labile hypertension. 7. Left lower extremity ulceration. 8. Diabetes mellitus. 9. Normocytic anemia, stable at baseline.  HISTORY AND BRIEF HOSPITAL COURSE:  Mr. [name] is a very pleasant 77- year-old male with a history of systolic heart failure status post dual- chamber pacemaker placed in 2008 as well as hypertension, hyperlipidemia, and diabetes who presented to [facility] from skilled nursing facility with progressively worsening abdominal pain.  The patient is a poor historian.  Per skilled nursing facility report, the patient was progressively declining, more lethargic, and not at his baseline.  He was admitted to the [facility] with lethargy and encephalopathy as well as severe sepsis secondary to urinary tract infection and hemorrhagic cystitis. He is placed in the step-down unit and critical care consultation was called.  Further Cardiology consultation was called and Urology consultation was called.  1. Hemorrhagic cystitis and acute bacterial prostatitis.  Initially     the patient was placed on vancomycin and Zosyn.  Urology consult     was called and the patient was seen by Dr. Jerilee Field.  The     patient  had a 14-French Foley in place that was draining bloody     urine, at that time Dr. Mena Goes saw him, it was removed and 18-     [place] Foley was placed.  Dr. Mena Goes diagnosed him with benign     prostatic hypertrophy with urinary tract infection and acute     bacterial prostatitis as well as hemorrhagic cystitis.  He     recommended the Foley be continued to monitor urine output that it     would be able to be discontinued when the patient was ambulatory.     He recommended checking a postvoid residuals several hours after     removing the Foley to ensure the patient was not having urinary     retention.  He recommended starting finasteride 5 mg daily and     adding tamsulosin 0.4 mg p.o. to prevent retention and urinary     tract infection in the future.  The patient will follow up with Dr.     Mena Goes at [facility] on October 31 at 10:45.  As the     patient improved, his urine culture returned greater than 100,000     colonies of E. coli that was pansensitive.  The patient will be     discharged on Keflex 500 mg  t.i.d. for 21 days. 2. Acute on chronic congestive heart failure with elevated troponin.     Cardiology consultation was called and Dr. Lance Muss saw     the patient on September 20, 2011.  The patient had a BNP of     greater than 70,000.  His troponin rose to 2.83.  Dr. Eldridge Dace felt     the patient was likely in some degree of heart failure, but his     volume status need to be managed closely as his renal function was     somewhat depressed.  He did not believe the positive troponin was     ACS and recommended not heparinizing the patient given his     hemorrhagic cystitis.  He did not feel that Mr. Vallin was a     candidate for invasive treatment and would manage him medically.     With regard to his congestive heart failure, the patient was     diuresed with IV Lasix.  He has now been returned to his home dose     of Lasix, which is 40 mg p.o. daily  and appears his symptomatic     congestive heart failure has resolved.  Further cardiology was     involved in managing the patient's blood pressure medication as his     blood pressure for the most part remained with systolics in the 110-     135 range, diastolics in the 50-80 range.  However, the patient     would occasionally spike his blood pressure with the systolic in     the 200s and diastolic in the 50s to 80s.  His blood pressure     medications were changed and the patient appears stable at this     point in time.  Further, the patient had a 2-D echocardiogram done     on September 21, 2011, this was done without contrast.  Study     conclusions, there is moderate concentric hypertrophy, systolic     function is severely reduced, estimated ejection fraction was in     the 20% to 25% range with diffuse hypokinesis.  Doppler parameters     are consistent with high ventricular filling pressure.  The patient     will be discharged back to a skilled nursing facility on his     aspirin, Coreg, and previous home dose of Lasix as well as 5 mg of     p.o. lisinopril daily. 3. Acute renal failure.  On admission, the patient was noted to have     an elevated creatinine of 2.2 with close monitoring of his volume     status and dosing of Lasix, his creatinine is 1.11 today, back to     his normal baseline. 4. Left lower extremity ulcer.  The patient had a Wound Care consult     for his left lower extremity ulceration.  The Wound Care nurse     documented an unstagable wound that was present on admission 3 x 5     cm in size to the inner heel.  He had 100% dry eschar intact.  His     outer heel of the unstagable wound 1.5 cm.  Plan, apply Santyl to     the left outer heel to gently debride nonviable tissue.  The     patient will have his heels floated to reduce pressure.  He would     also benefit from ankle-brachial indices to assess  perfusion.     Consequently, we recommend the patient  have ABIs done as an     outpatient. 5. Diabetes mellitus.  The patient's blood sugars during this     hospitalization remained between 135 and 250.  His hemoglobin A1c     indicated that his blood sugars were uncontrolled, it was 9.1 on     September 20, 2011.  The patient will be discharged back to the     skilled nursing facility on 10 units of Lantus subcutaneously at     bedtime.  We would expect his CBGs to improve as his infection     clears.  We would recommend the primary care physician at the     skilled nursing facility monitor and adjust his diabetic     medications accordingly.  PHYSICAL EXAMINATION:  GENERAL:  The patient is currently alert and oriented in no apparent distress.  He is lying in bed. VITAL SIGNS:  Temperature 99.5, pulse 57, respirations 20, blood pressure 95/57, O2 sats 100% on room air. HEENT:  Head is atraumatic, normocephalic.  Eyes are anicteric.  Pupils are equal, round, and reactive to light.  No nasal discharge or exterior lesions.  Mouth has moist mucous membranes with moderate dentition. NECK:  Supple with midline trachea.  He does have an obvious jugular venous pulsation. CHEST:  Demonstrates no accessory muscle use.  He has no wheezes or crackles to my auscultation. HEART:  Regular rate and rhythm without obvious murmurs, rubs, gallops. ABDOMEN:  Has very active bowel sounds.  Soft, nontender, nondistended. EXTREMITIES:  No clubbing, cyanosis, or edema.  He does have his bandages on the distal portion of his right lower extremity covering both inner heel and outer heel ulcerations. PSYCHIATRIC:  Patient is alert and oriented.  Demeanor is pleasant, cooperative.  Grooming is moderate.  PERTINENT LABS:  This hospitalization, on October 2, the patient's WBC is 13.1, hemoglobin 10.4, hematocrit 31.5, platelets 256, glucose 135. Sodium 142, potassium 3.9, chloride 105, bicarb 31, glucose 135, BUN 20, creatinine 1.11, calcium 8.8.  As mentioned,  the patient had a BNP of greater than 70,000.  This was on September 21, 2011.  His cardiac enzymes were cycled.  He did have elevated troponins that peaked at 2.83.  Cortisol level was tested during his hospitalization on September 22, 2011 and found to be within normal limits at 20.3.  MICROBIOLOGY:  Blood cultures x2 were drawn at the time of admission. They show no growth to date.  This is still in preliminary status. Urine culture drawn on September 20, 2011 grew out greater than 100,000 colonies of E. coli pansensitive.  RADIOLOGICAL EXAM:  The patient had a two-view x-ray of his chest that showed cardiomegaly with pulmonary vascular congestion.  No frank interstitial edema.  He has CT of his abdomen and pelvis done on September 20, 2011 that showed a thick-walled bladder with probable intraluminal hemorrhage and gas suggesting hemorrhagic emphysematous cystitis.  Followup chest x-ray was done on September 22, 2011 that shows cardiomegaly without edema and x-rays done to assess for osteomyelitis was done to his left foot that showed no acute finding. There are calcaneal spurs, no evidence of fracture or osteomyelitis.  DISCHARGE MEDICATIONS: 1. Carvedilol 12.5 mg 1 tablet by mouth twice daily with meals. 2. Santyl ointment one application topically daily to inner and outer     ulcerations on his left lower extremity. 3. Finasteride 5 mg 1 tablet by mouth daily. 4. Lisinopril 5 mg 1  tablet daily by mouth. 5. Senna 2 tablets daily by mouth as needed for constipation. 6. Tamsulosin 0.4 mg 1 pill daily by mouth. 7. Keflex 500 mg 1 tablet by mouth three times a day. 8. Aspirin 81 mg 1 tablet by mouth daily. 9. Frusemide 40 mg 1 tablet by mouth daily. 10.ICAP OTC 1 tablet by mouth daily. 11.Lantus 10 units subcutaneously daily at bedtime. 12.Lipitor 20 mg 1 tablet by mouth daily at bedtime. 13.Percocet 5/225 two tablets by mouth every 4 hours as needed for     pain.  DISCHARGE  INSTRUCTIONS:  The patient will be discharged to skilled nursing facility that will be low-sodium heart-healthy  WOUND CARE:  The patient will receive Santyl ointment applied to both inner and outer ulcerations daily.  He needs followup with wound care RN.  His heels need to be floated while he is in bed.  FOLLOWUP APPOINTMENTS:  He will see Dr. Jerilee Field of [facility] October 25, 2011 at 10:45 a.m.  He will also see his primary care physician at the skilled nursing facility.  SUGGESTIONS FOR OUTPATIENT FOLLOWUP: 1. The patient's diabetes is currently uncontrolled.  We would     recommend that the skilled nursing facility physician monitor and     adjust his diabetic medications accordingly. 2. The patient did have acute renal failure in the hospital, which has     resolved; however, he is being discharged on lisinopril and Lasix.     Consequently, we would suggest the primary care physician at    skilled nursing facility that his BUN and creatinine be checked. 3. The patient would benefit from having Ankle Brachial Indices checked as an outpatient.       Stephani Police, PA   ______________________________ Thad Ranger, MD    MLY/MEDQ  D:  09/26/2011  T:  09/26/2011  Job:  [id number]  cc:   [facility] Jerilee Field, MD Jake Bathe, MD  Electronically Signed by Algis Downs PA on 09/30/2011 08:17:07 PM Electronically Signed by Andres Labrum Neythan Kozlov  on 10/09/2011 02:17:01 PM

## 2011-10-11 NOTE — Consult Note (Signed)
[name], GRIVAS NO.:  [id number]  MEDICAL RECORD NO.:  [id number]  LOCATION:  2608                         FACILITY:  [facility]  PHYSICIAN:  Jerilee Field, MD   DATE OF BIRTH:  [birth year]  DATE OF CONSULTATION: DATE OF DISCHARGE:                                CONSULTATION   Consult was requested by Dr. Lenise Arena for urinary tract infection and hemorrhagic cystitis.  HISTORY OF PRESENT ILLNESS:  [name] is an 77 year old male who lives in a nursing facility.  He has had 2-day history of lethargy and suprapubic pain.  The patient has been admitted to the hospital and given antibiotics.  He is feeling much better now.  He is actually alert and oriented x3 and can give a good history.  He denies any prior GU history or surgery.  He denies any GU medication.  He does note a several-day history of increasing dysuria, frequency, urgency, and hematuria.  Of note, he did have a urinary tract infection when he was in the hospital in July of this year.  The patient has also had suprapubic pain and discomfort.  PAST MEDICAL HISTORY:  Significant for: 1. Systolic heart failure with dual-chamber pacemaker placement. 2. Hypertension. 3. Hyperlipidemia. 4. Diabetes.  SOCIAL HISTORY:  The patient lives as a resident at [facility] and is a full code.  ALLERGIES:  No known drug allergies.  CURRENT MEDICATIONS:  Tylenol, Ventolin, Rocephin, Lasix, heparin, insulin.  REVIEW OF SYSTEMS:  Ten-system review of systems obtained.  The patient complained of some chest pain and shortness of breath.  All other review of systems negative except as per HPI.  PHYSICAL EXAMINATION:  VITAL SIGNS:  The patient's temperature is 98.2, respirations 31, heart rate 84, sating 100% on nasal cannula with blood pressure 134/59.  His urine output is 500 mL of bloody urine in the Foley bag, but there are no clots. GENERAL:  On exam, he is in no acute  distress. NEUROLOGIC:  He is awake, alert and oriented. HEENT:  Atraumatic.  Pupils equal, reactive to light. CARDIOVASCULAR:  Distant heart sounds and irregular rate. LUNGS:  Normal respiratory effort in depth. ABDOMEN:  Soft, nontender, nondistended apart from the suprapubic area which is tender to palpation, although this area is soft and his bladder is not so distended. UROLOGIC:  Penis is normal.  The testicles are normal without masses. There is a 14-French Foley in place draining bloody urine that again has no clots.  Digital rectal exam was performed that showed an enlarged prostate that was smooth without any hard areas or nodules. EXTREMITIES:  Some pitting edema and a heel ulcer on his left heel.  I tried to irrigate his Foley catheter, but the 14-French was too small to adequately irrigate the hematuria and it was difficult to irrigate. Therefore, it was removed and an 18-French Foley was placed in the usual sterile fashion without difficulty.  The balloon was inflated and seated at the bladder neck.  The bladder was then irrigated to clear with a liter of water without difficulty.  LABORATORY DATA:  White count is 26, hemoglobin 12, hematocrit 37. Potassium 3.8, BUN 21, creatinine 1.87.  Alk phos is  normal.  Urinalysis showed many bacteria with too-numerous-to-count white cells and red cells.  RADIOLOGIC DATA:  CT scan was obtained.  I did review the images, showed no stone or hydronephrosis of either kidney or ureter.  The bladder was thick walled and full of urine with high density, light quality with some air in the bladder.  IMPRESSION: 1. Benign prostatic hypertrophy, urinary tract infection/acute     bacterial prostatitis. 2. Hemorrhagic cystitis. 3. Congestive heart failure. 4. Acute renal failure.  PLAN:  Continue the Foley for now to monitor urine output.  The Foley can be discontinued when the patient is ambulatory and stable and ready to try to urinate  on his own.  I would check a postvoid residual several hours after removing the Foley to ensure the patient is not in urinary retention.  Also, when the patient is taking p.o., I would start finasteride 5 mg daily and if stable, we would also add tamsulosin 0.4 mg p.o. daily to help prevent retention and UTI in the future.  I did discuss these medicines with the patient and he agrees to start.          ______________________________ Jerilee Field, MD     ME/MEDQ  D:  09/20/2011  T:  09/20/2011  Job:  [id number]  Electronically Signed by Jerilee Field MD on 10/11/2011 01:04:19 PM

## 2011-10-19 ENCOUNTER — Emergency Department (HOSPITAL_COMMUNITY): Payer: Medicare Other

## 2011-10-19 ENCOUNTER — Inpatient Hospital Stay (HOSPITAL_COMMUNITY)
Admission: EM | Admit: 2011-10-19 | Discharge: 2011-10-23 | Disposition: A | Discharge status: Skilled Nursing Facility | Payer: Medicare Other | Source: Home / Self Care | Attending: Internal Medicine | Admitting: Internal Medicine

## 2011-10-19 DIAGNOSIS — E785 Hyperlipidemia, unspecified: Secondary | ICD-10-CM | POA: Diagnosis present

## 2011-10-19 DIAGNOSIS — Z7902 Long term (current) use of antithrombotics/antiplatelets: Secondary | ICD-10-CM

## 2011-10-19 DIAGNOSIS — I739 Peripheral vascular disease, unspecified: Secondary | ICD-10-CM | POA: Diagnosis present

## 2011-10-19 DIAGNOSIS — A498 Other bacterial infections of unspecified site: Secondary | ICD-10-CM | POA: Diagnosis present

## 2011-10-19 DIAGNOSIS — IMO0002 Reserved for concepts with insufficient information to code with codable children: Secondary | ICD-10-CM | POA: Diagnosis present

## 2011-10-19 DIAGNOSIS — E1169 Type 2 diabetes mellitus with other specified complication: Secondary | ICD-10-CM | POA: Diagnosis present

## 2011-10-19 DIAGNOSIS — N183 Chronic kidney disease, stage 3 unspecified: Secondary | ICD-10-CM | POA: Diagnosis present

## 2011-10-19 DIAGNOSIS — Z79899 Other long term (current) drug therapy: Secondary | ICD-10-CM

## 2011-10-19 DIAGNOSIS — I69959 Hemiplegia and hemiparesis following unspecified cerebrovascular disease affecting unspecified side: Secondary | ICD-10-CM

## 2011-10-19 DIAGNOSIS — I509 Heart failure, unspecified: Secondary | ICD-10-CM | POA: Diagnosis present

## 2011-10-19 DIAGNOSIS — Z794 Long term (current) use of insulin: Secondary | ICD-10-CM

## 2011-10-19 DIAGNOSIS — I2510 Atherosclerotic heart disease of native coronary artery without angina pectoris: Secondary | ICD-10-CM | POA: Diagnosis present

## 2011-10-19 DIAGNOSIS — E860 Dehydration: Secondary | ICD-10-CM | POA: Diagnosis present

## 2011-10-19 DIAGNOSIS — G459 Transient cerebral ischemic attack, unspecified: Secondary | ICD-10-CM | POA: Diagnosis present

## 2011-10-19 DIAGNOSIS — L8995 Pressure ulcer of unspecified site, unstageable: Secondary | ICD-10-CM | POA: Diagnosis present

## 2011-10-19 DIAGNOSIS — I10 Essential (primary) hypertension: Secondary | ICD-10-CM | POA: Diagnosis present

## 2011-10-19 DIAGNOSIS — I5022 Chronic systolic (congestive) heart failure: Secondary | ICD-10-CM | POA: Diagnosis present

## 2011-10-19 DIAGNOSIS — Z7982 Long term (current) use of aspirin: Secondary | ICD-10-CM

## 2011-10-19 DIAGNOSIS — E119 Type 2 diabetes mellitus without complications: Secondary | ICD-10-CM | POA: Diagnosis present

## 2011-10-19 DIAGNOSIS — I129 Hypertensive chronic kidney disease with stage 1 through stage 4 chronic kidney disease, or unspecified chronic kidney disease: Secondary | ICD-10-CM | POA: Diagnosis present

## 2011-10-19 DIAGNOSIS — E1165 Type 2 diabetes mellitus with hyperglycemia: Secondary | ICD-10-CM | POA: Diagnosis present

## 2011-10-19 DIAGNOSIS — Z950 Presence of cardiac pacemaker: Secondary | ICD-10-CM

## 2011-10-19 DIAGNOSIS — R5381 Other malaise: Secondary | ICD-10-CM | POA: Diagnosis present

## 2011-10-19 DIAGNOSIS — N390 Urinary tract infection, site not specified: Secondary | ICD-10-CM | POA: Diagnosis present

## 2011-10-19 DIAGNOSIS — G929 Unspecified toxic encephalopathy: Principal | ICD-10-CM | POA: Diagnosis present

## 2011-10-19 DIAGNOSIS — I70269 Atherosclerosis of native arteries of extremities with gangrene, unspecified extremity: Secondary | ICD-10-CM | POA: Diagnosis present

## 2011-10-19 DIAGNOSIS — R64 Cachexia: Secondary | ICD-10-CM | POA: Diagnosis present

## 2011-10-19 DIAGNOSIS — L97809 Non-pressure chronic ulcer of other part of unspecified lower leg with unspecified severity: Secondary | ICD-10-CM | POA: Diagnosis present

## 2011-10-19 DIAGNOSIS — L89609 Pressure ulcer of unspecified heel, unspecified stage: Secondary | ICD-10-CM | POA: Diagnosis present

## 2011-10-19 DIAGNOSIS — E1159 Type 2 diabetes mellitus with other circulatory complications: Secondary | ICD-10-CM | POA: Diagnosis present

## 2011-10-19 DIAGNOSIS — G92 Toxic encephalopathy: Principal | ICD-10-CM | POA: Diagnosis present

## 2011-10-19 LAB — URINE MICROSCOPIC-ADD ON

## 2011-10-19 LAB — COMPREHENSIVE METABOLIC PANEL
ALT: 32 U/L (ref 0–53)
Alkaline Phosphatase: 67 U/L (ref 39–117)
CO2: 29 mEq/L (ref 19–32)
Calcium: 9.4 mg/dL (ref 8.4–10.5)
GFR calc Af Amer: 71 mL/min — ABNORMAL LOW (ref >90)
GFR calc non Af Amer: 61 mL/min — ABNORMAL LOW (ref >90)
Glucose, Bld: 171 mg/dL — ABNORMAL HIGH (ref 70–99)
Potassium: 4.9 mEq/L (ref 3.5–5.1)
Sodium: 140 mEq/L (ref 135–145)

## 2011-10-19 LAB — URINALYSIS, ROUTINE W REFLEX MICROSCOPIC
Hgb urine dipstick: NEGATIVE
Nitrite: NEGATIVE
Protein, ur: NEGATIVE mg/dL
Specific Gravity, Urine: 1.02 (ref 1.005–1.030)
Urobilinogen, UA: 1 mg/dL (ref 0.0–1.0)

## 2011-10-19 LAB — CBC
HCT: 31.9 % — ABNORMAL LOW (ref 39.0–52.0)
Hemoglobin: 10.4 g/dL — ABNORMAL LOW (ref 13.0–17.0)
MCHC: 32.6 g/dL (ref 30.0–36.0)
MCV: 86 fL (ref 78.0–100.0)
RDW: 14.9 % (ref 11.5–15.5)
WBC: 15.7 10*3/uL — ABNORMAL HIGH (ref 4.0–10.5)

## 2011-10-19 LAB — GLUCOSE, CAPILLARY: Glucose-Capillary: 171 mg/dL — ABNORMAL HIGH (ref 70–99)

## 2011-10-20 ENCOUNTER — Inpatient Hospital Stay (HOSPITAL_COMMUNITY): Payer: Medicare Other

## 2011-10-20 DIAGNOSIS — R29810 Facial weakness: Secondary | ICD-10-CM

## 2011-10-20 DIAGNOSIS — R4789 Other speech disturbances: Secondary | ICD-10-CM

## 2011-10-20 LAB — BASIC METABOLIC PANEL
BUN: 29 mg/dL — ABNORMAL HIGH (ref 6–23)
Calcium: 9.1 mg/dL (ref 8.4–10.5)
Chloride: 104 mEq/L (ref 96–112)
Creatinine, Ser: 0.91 mg/dL (ref 0.50–1.35)
GFR calc Af Amer: 89 mL/min — ABNORMAL LOW (ref >90)
GFR calc non Af Amer: 77 mL/min — ABNORMAL LOW (ref >90)

## 2011-10-20 LAB — CBC
Hemoglobin: 9.6 g/dL — ABNORMAL LOW (ref 13.0–17.0)
Platelets: 273 10*3/uL (ref 150–400)
RBC: 3.46 MIL/uL — ABNORMAL LOW (ref 4.22–5.81)
RDW: 14.8 % (ref 11.5–15.5)
WBC: 13 10*3/uL — ABNORMAL HIGH (ref 4.0–10.5)

## 2011-10-20 LAB — GLUCOSE, CAPILLARY
Glucose-Capillary: 106 mg/dL — ABNORMAL HIGH (ref 70–99)
Glucose-Capillary: 129 mg/dL — ABNORMAL HIGH (ref 70–99)

## 2011-10-20 LAB — URINE CULTURE

## 2011-10-20 LAB — LIPID PANEL
Cholesterol: 97 mg/dL (ref 0–200)
Total CHOL/HDL Ratio: 2.6 RATIO

## 2011-10-20 LAB — HEMOGLOBIN A1C
Hgb A1c MFr Bld: 7.6 % — ABNORMAL HIGH (ref <5.7)
Mean Plasma Glucose: 171 mg/dL — ABNORMAL HIGH (ref <117)

## 2011-10-20 NOTE — H&P (Signed)
[name], PHILLIPS NO.:  [id number]  MEDICAL RECORD NO.:  [id number]  LOCATION:  [facility]                         FACILITY:  [facility]  PHYSICIAN:  Isidor Holts, M.D.  DATE OF BIRTH:  May 27, 1927  DATE OF ADMISSION:  10/19/2011 DATE OF DISCHARGE:                             HISTORY & PHYSICAL   PRIMARY MD:  Karlene Einstein, MD  PRIMARY CARDIOLOGIST:  Jake Bathe, MD  PRIMARY UROLOGIST:  Jerilee Field, MD  CHIEF COMPLAINT:  Left-sided weakness.  HISTORY OF PRESENT ILLNESS:  This is an 77 year old male, resident of [facility].  History is vague.  The patient is a poor historian and unable to contribute effectively to history, however, as far as can be determined from the PMD and nursing facility records, the patient was found this a.m. to have increased left facial droop, as well as left-sided weakness.  He does have a known history of previous CVA with left hemiparesis.  According to the patient, however, at the time of this evaluation, symptoms have since improved.  PAST MEDICAL HISTORY: 1. Systolic congestive heart failure, ejection fraction 20% to 25% per     2D echocardiogram September 09, 2011. 2. Status post permanent pacer for complete heart block.  The patient     had old pacing generator explanted September 2008 and dual-chamber     [employer] permanent pacer placed at that time. 3. History of hemorrhoids. 4. Spinal stenosis. 5. Hypertension. 6. Status post previous CVA/left hemiparesis. 7. Type 2 diabetes mellitus insulin-requiring. 8. Dyslipidemia. 9. Left lower extremity foot ulcer. 10.Status post hospitalization September 20, 2011 to September 26, 2011     at [facility] for hemorrhagic cystitis secondary to E.     coli/sepsis.  He was discharged at that time, with the Foley     catheter.  ALLERGIES:  No known drug allergies.  MEDICATION HISTORY: 1. Keflex 500 mg p.o. t.i.d. commenced on September 26, 2011,  completed     October 17, 2011. 2. Senna 2 tablets p.o. p.r.n. daily for constipation. 3. Lisinopril 5 mg p.o. daily. 4. Finasteride 5 mg p.o. at bedtime. 5. Zinc oxide 220 mg p.o. daily. 6. Collagenase (Santyl ointment) applied topically to affected area of     skin daily. 7. Carvedilol 12.5 mg p.o. b.i.d. 8. Pro-Stat 64, 30 mL p.o. b.i.d. 9. Tamsulosin 0.4 mg p.o. daily. 10.Percocet (5/325) 2 tablets p.o. p.r.n. q.4 hourly for pain. 11.Lipitor 20 mg p.o. at bedtime. 12.ICaps OTC 1 tablet p.o. daily. 13.Aspirin 81 mg p.o. daily. 14.Boost 1 can p.o. t.i.d. before meals. 15.Remeron 7.5 mg p.o. at bedtime. 16.Aldactone 25 mg p.o. daily. 17.Furosemide 40 mg p.o. daily. 18.Lantus insulin 10 units subcutaneously at bedtime. 19.Rocephin started on October 18, 2011, i.e., 1 g IM daily. 20.Plavix 75 mg p.o. daily. 21.Skelaxin 800 mg p.o. t.i.d. p.r.n. for muscle spasms.  SYSTEMS REVIEW:  The patient denies abdominal pain, vomiting, or diarrhea.  Denies fever or chills.  Denies shortness of breath, chest pain, or cough.  The patient, however, complains of generalized pain, particularly in the left hip, left knee, and ankle.  He denies recent fall.  States that he is able to ambulate with physiotherapy  and considerable assistance, otherwise mostly bed bound.  Rest of systems review is negative.  SOCIAL HISTORY:  The patient is a resident of [facility].  Not currently a drinker or smoker.  FAMILY HISTORY:  Unobtainable at this time.  PHYSICAL EXAMINATION:  VITALS:  Temperature 98.4, pulse 64 per minute, respiratory rate 14, BP 173/62 mmHg, rechecked 144/45 mmHg, pulse oximeter 99% on room air. GENERAL:  The patient did not appear to be in obvious acute discomfort at time of this evaluation, alert, follows simple commands, communicative, appears to be oriented.  Does have facial asymmetry. Speech is mildly dysarthric, but clearly understandable. NECK:  Supple.  JVP  not seen.  No palpable lymphadenopathy.  No palpable goiter. CHEST:  Clinically clear to auscultation.  No wheezes, no crackles. HEART:  Sounds 1 and 2 heard.  Normal and regular.  No murmurs. ABDOMEN:  Full, soft, nontender.  Unable to palpate organs. EXTREMITIES:  Lower extremity:  No pitting edema.  Palpable peripheral pulses.  The patient does have stageable left heel decubitus with eschar. MUSCULOSKELETAL SYSTEM:  The patient does have generalized osteoarthritic changes.  Moves all limbs but slowly which the patient claims secondary to pain. CENTRAL NERVOUS SYSTEM:  As described the patient does indeed have mild left facial asymmetry as well as mild dysarthria.  He moves all limbs, but with considerable difficulty likely limited by pain.  INVESTIGATIONS:  CBC:  WBC 15.7, hemoglobin 10.5, hematocrit 31.9, platelets 291,000.  INR 1.30.  Electrolytes: Sodium 140, potassium 4.9, chloride 100, CO2 29, BUN 63, creatinine 1.09, glucose 171.  LFTs: Total bilirubin 0.8, alkaline phosphatase 67, AST 66, ALT 32.  Urinalysis shows WBCs 0-2, RBCs 0-2, bacteria rare.  CBG 171.  Head CT scan October 19, 2011 shows atrophy, chronic microvascular ischemia.  No acute intracranial pathology.  X-rays of the hip October 19, 2011 showed degenerative changes but no acute fracture.  Chest x-ray October 19, 2011 shows stable cardiomegaly, no acute pulmonary findings.  Cardiac monitor shows paced rhythm.  ASSESSMENT AND PLAN: 1. Recurrent cerebrovascular accident versus transient ischemic     attack.  The patient's symptoms were already improving.  Given the     duration of symptoms, the patient presented outside the window for     possible tPA.  Head CT scan is negative and unfortunately brain MRI     is not possible at this time, secondary to presence of a pacemaker.     We shall therefore institute CVA/TIA workup, commence PT/OT and     manage the patient with preadmission doses of antiplatelet      medication when p.o. intake is possible.  For now, we shall     maintain n.p.o. status, until swallow screen is done.  2. History of dyslipidemia.  We shall check lipid profile and TSH and     when p.o. intake is feasible, continue statin treatment.  3. History of chronic congestive heart failure.  The patient is not in     overt clinical congestive heart failure at this time and actually     looks "dry." We have therefore discontinued his diuretics for now.  4. Dehydration.  As evidenced by elevated BUN/creatinine ratio.  We     shall hold diuretics and manage with gentle intravenous fluids, very     mindful of the patient's known history of low ejection fraction and     congestive heart failure.  5. Type 2 diabetes mellitus.  This is insulin-requiring.  CBG is  mildly elevated at this time.  We shall manage with sliding scale     insulin coverage.  Check hemoglobin A1c for completeness.  6. Unstageable left heel decubitus.  We shall utilize heel lifts, wet-     to-dry dressings, until seen by the wound care team and do ankle-     brachial indices to rule out peripheral vascular disease.  7. History of hypertension.  Blood pressure is mildly elevated at the     present time, but acceptable in the context of possible acute    cerebrovascular accident.  We shall therefore utilize p.r.n.     antihypertensives and set parameters for now.  8. Joint pain.  Shall manage with analgesics.    Further management will depend on clinical course.     Isidor Holts, M.D.     CO/MEDQ  D:  10/19/2011  T:  10/19/2011  Job:  [id number]  cc:   Karlene Einstein, M.D. Jake Bathe, MD Jerilee Field, MD  Electronically Signed by Isidor Holts M.D. on 10/20/2011 07:08:03 PM

## 2011-10-21 LAB — CBC
MCH: 28 pg (ref 26.0–34.0)
MCHC: 32.4 g/dL (ref 30.0–36.0)
Platelets: 277 10*3/uL (ref 150–400)
RBC: 3.39 MIL/uL — ABNORMAL LOW (ref 4.22–5.81)

## 2011-10-21 LAB — BASIC METABOLIC PANEL
Calcium: 8.7 mg/dL (ref 8.4–10.5)
GFR calc non Af Amer: 84 mL/min — ABNORMAL LOW (ref >90)
Potassium: 3.7 mEq/L (ref 3.5–5.1)
Sodium: 141 mEq/L (ref 135–145)

## 2011-10-21 LAB — GLUCOSE, CAPILLARY
Glucose-Capillary: 148 mg/dL — ABNORMAL HIGH (ref 70–99)
Glucose-Capillary: 297 mg/dL — ABNORMAL HIGH (ref 70–99)
Glucose-Capillary: 159 mg/dL — ABNORMAL HIGH (ref 70–99)

## 2011-10-22 DIAGNOSIS — I739 Peripheral vascular disease, unspecified: Secondary | ICD-10-CM

## 2011-10-22 LAB — BASIC METABOLIC PANEL
CO2: 28 mEq/L (ref 19–32)
Chloride: 106 mEq/L (ref 96–112)
Glucose, Bld: 137 mg/dL — ABNORMAL HIGH (ref 70–99)

## 2011-10-22 LAB — GLUCOSE, CAPILLARY
Glucose-Capillary: 131 mg/dL — ABNORMAL HIGH (ref 70–99)
Glucose-Capillary: 222 mg/dL — ABNORMAL HIGH (ref 70–99)
Glucose-Capillary: 194 mg/dL — ABNORMAL HIGH (ref 70–99)

## 2011-10-22 LAB — PRO B NATRIURETIC PEPTIDE: Pro B Natriuretic peptide (BNP): 5447 pg/mL — ABNORMAL HIGH (ref 0–450)

## 2011-10-23 LAB — BASIC METABOLIC PANEL
CO2: 28 mEq/L (ref 19–32)
Calcium: 8.9 mg/dL (ref 8.4–10.5)
Creatinine, Ser: 0.73 mg/dL (ref 0.50–1.35)
GFR calc non Af Amer: 84 mL/min — ABNORMAL LOW (ref >90)
Sodium: 139 mEq/L (ref 135–145)

## 2011-10-23 LAB — GLUCOSE, CAPILLARY
Glucose-Capillary: 221 mg/dL — ABNORMAL HIGH (ref 70–99)
Glucose-Capillary: 314 mg/dL — ABNORMAL HIGH (ref 70–99)

## 2011-10-23 LAB — CBC
MCH: 28 pg (ref 26.0–34.0)
MCHC: 32.3 g/dL (ref 30.0–36.0)
MCV: 86.8 fL (ref 78.0–100.0)
Platelets: 295 10*3/uL (ref 150–400)

## 2011-10-23 NOTE — Discharge Summary (Signed)
NAMEBURRIS, [name] NO.:  [id number]  MEDICAL RECORD NO.:  [id number]  LOCATION:  3027                         FACILITY:  [facility]  PHYSICIAN:  Isidor Holts, M.D.  DATE OF BIRTH:  1927/01/14  DATE OF ADMISSION:  10/19/2011 DATE OF DISCHARGE:  10/23/2011                        DISCHARGE SUMMARY - REFERRING   PRIMARY CARE PHYSICIAN:  Karlene Einstein, MD.  PRIMARY CARDIOLOGIST:  Jake Bathe, MD.  PRIMARY UROLOGIST:  Jerilee Field, MD.  DISCHARGE DIAGNOSES: 1. Transient ischemic attack. 2. History of previous cerebrovascular accident/left hemiparesis. 3. Mild dehydration. 4. Insulin-requiring type 2 diabetes mellitus. 5. Dyslipidemia. 6. Hypertension. 7. History of systolic congestive heart failure, ejection fraction 20%     to 25%. 8. Status post permanent pacemaker for complete heart block. 9. History of spinal stenosis/lumbar sacral degenerative joint     disease. 10.Chronic left heel decubitus. 11.Peripheral vascular disease (ABIs right equal 0.46; left equal 0.58)     not a candidate for revascularization surgery.  DISCHARGE MEDICATIONS: 1. NovoLog insulin per sliding scale as follows.  For CBG 70-120, no     insulin. CBG 121-150, 1 unit.  CBG 151-200, 2 units.  CBG 201-250,     3 units.  CBG 351-300, 5 units.  CBG 301-350, 7 units.  CBG 351-     400, 9 units. 2. Levaquin 500 mg p.o. daily for 7 days only. 3. Lotrimin AF 1% cream applied to affected area of glans penis b.i.d.     for 7 days. 4. Lantus insulin 15 units subcutaneously at bedtime (was on 20 units     subcutaneously at bedtime). 5. Aldactone 25 mg p.o. daily. 6. Aspirin 81 mg p.o. daily. 7. Boost 1 can p.o. t.i.d. before meals. 8. Carvedilol 12.5 mg p.o. b.i.d. 9. Santyl ointment 1 application topically b.i.d. to left heel wound. 10.Finasteride 5 mg p.o. daily. 11.I caps OTC 1 tab p.o. daily. 12.Lipitor 20 mg p.o. at bedtime. 13.Lisinopril 5 mg p.o. daily. 14.Percocet  (5/325) 2 tablets p.o. p.r.n. q.4 h. for pain. 15.Plavix 75 mg p.o. daily. 16.Prostat 64 30 mL p.o. b.i.d. 17.Remeron 7.5 mg p.o. at bedtime. 18.Skelaxin 800 mg p.o. p.r.n. t.i.d. for muscle spasms. 19.Senokot 2 tablets p.o. p.r.n. daily for constipation. 20.Tamsulosin 0.4 mg p.o. daily. 21.Zinc oxide 220 mg p.o. daily.  Note:  Furosemide has been discontinued, secondary to dehydration.  Also ceftriaxone and Keflex have been discontinued.  PROCEDURES: 1. Head CT scan October 19, 2011.  This showed atrophy and chronic     microvascular ischemia.  No acute abnormality. 2. Hip x-ray, October 19, 2011.  This showed both hips normally     located, moderate degenerative changes, no acute pelvic fractures. 3. Chest x-ray, October 19, 2011.  This showed stable cardiac     enlargement.  No acute pulmonary findings. 4. CT lumbar spine, October 20, 2011.  This showed previous     decompression at L3-L4 and L4-L5.  At L4-5 there is a grade I     spondylolisthesis with very severe residual facet hypertrophy     multifactorial, severe bilateral L4 foraminal stenosis and possible     lateral recess stenosis.  At L3-L4, there is residual facet  degeneration and moderate-to-severe bilateral L3-4 foraminal     stenosis and possible lateral recess stenosis, moderate-to-severe     multifactorial bilateral foraminal stenosis, no lumbar spinal     stenosis, comparatively mild for age.  No disc degeneration     elsewhere in the lumbar spine. 5. A 2D echocardiogram October 20, 2011.  This showed mildly dilated     left ventricular cavity size, systolic function was moderately     reduced.  Estimated ejection fraction 40% to 45% with diffuse     hypokinesis. 6. Bilateral lower extremity arterial Dopplers.  This showed moderate-     to-severe reduction of ABIs, great toe PPG waveforms are flat.     ABIs were reported as showing on the right 0.46 on the left 0.58. 7. Bilateral carotid artery duplex scan  October 20, 2011.  This showed     evidence of stenosis involving the internal carotid arteries     bilaterally.  CONSULTATIONS:  Larina Earthly, MD, Vascular Surgeon.  ADMISSION HISTORY:  As in H and P notes of October 19, 2011, dictated by this MD. However, in brief, this is an 77 year old male, with known history of chronic systolic congestive heart failure, ejection fraction 20% to 25% per 2D echocardiogram September 2012, status post permanent pacer for complete heart block, history of hemorrhoids, spinal stenosis, hypertension, status post previous CVA/left hemiparesis, insulin- requiring type 2 diabetes mellitus, dyslipidemia, chronic left lower extremity for heel ulcer, status post hospitalization September 20, 2011, to September 26, 2011, at [facility] for hemorrhagic cystitis secondary to E. coli/sepsis, discharged with a Foley catheter in situ, presenting with altered mental status and what was described as increase in left-sided facial droop, as well as left-sided weakness.  The patient was admitted for further evaluation, investigation, and management.  CLINICAL COURSE: 1. TIA.  The patient presented with increased left facial     asymmetry/left-sided weakness, i.e., progressed from baseline of     residual left hemiparesis from previous CVA.  He underwent CVA/TIA     workup including head CT scan.  Unfortunately, the brain MRI could     not be done, secondary to the presence of a pacemaker.  Be that as     it may, head CT scan was negative for acute pathology.  Bilateral     carotid artery duplex scans showed no evidence of stenosis.  A 2D     echocardiogram was unremarkable, and as matter of fact, his     ejection fraction had improved from prior.  By October 20, 2011,     all evidence of new focal neurologic deficit had resolved and the     patient was back to baseline neurologic status.  It is clear that the     patient had a TIA.  He is currently on maximal  treatment with a     combination of aspirin and Plavix, which we have continued.  2. Mild dehydration.  The patient presented with a BUN of 63,     creatinine 1.09, against a background of diuretic therapy, including     spironolactone and Lasix.  Both were discontinued.  The patient was     managed with gentle intravenous fluid hydration.  We are pleased to     note that as of October 22, 2011, his BUN was 23, creatinine 0.73,     i.e., improved.  We have reinstated his spironolactone, but     have continued to hold Lasix.  3. History of chronic systolic congestive heart failure.  The     patient's ejection fraction was described as a "20% to 25%" per 2D     echocardiogram of September 09, 2011.  During this hospitalization,     this was repeated and it appears that the patient's ejection     fraction has improved.  A 2D echocardiogram of October 20, 2011,     showed an ejection fraction of 40% to 45%, although there was     diffuse hypokinesis.  Certainly, during the course of this     hospitalization, the patient showed no evidence of overt congestive     heart failure.  4. Type 2 diabetes mellitus.  This is insulin requiring.  The patient     was managed with sliding scale insulin coverage, scheduled Lantus     insulin, as well as carbohydrate modified diet, and his CBGs were     reasonably controlled.  5. Dyslipidemia.  The patient's lipid profile was as follows:  Total     cholesterol 97, triglycerides 64, HDL 37, LDL 47, i.e., excellent     lipid profile.  Of interest, his TSH was normal at 0.785.  6. Hypertension.  The patient remained normotensive during the course     of this hospitalization.  7. Left heel decubitus.  This appears to be chronic, and certainly was     present during his last hospitalization.  The patient was evaluated     by the wound care team and recommended heel lift as well as     avoidance of contact.  This was implemented accordingly.  8.  Multifactorial lumbosacral DJD.  The patient did complain of lower     extremity pains at the time of presentation.  This was felt     possibly secondary to neuropathy versus radiculopathy, and he     underwent lumbosacral CT scan. For details, refer to procedure list     above.  This showed multilevel DJD with foraminal spinal stenosis;     however, no evidence of spinal canal stenosis.  Over the course of     his hospitalization, his lower extremity pains have considerably     ameliorated with analgesic therapy.  9. Peripheral vascular disease.  As part of evaluation for bilateral     lower extremity pains, ABIs were done to rule out significant     peripheral vascular disease.  This showed an ABI of 0.46 on the     right and 0.58 on the left, which was consistent with at least     moderately severe peripheral vascular disease.  Dr. Tawanna Cooler Early,     vascular surgeon, was called on consultation and has opined that     the patient is not very ambulant, and likely has small vessel     disease, not amenable to revascularization surgery.  He has     recommended noncontact boots for the left heel and has also opined     that should the patient's wound continue to get worse, he might     require a below-knee amputation as the only feasible surgical     option.  DISPOSITION:  The patient as of October 23, 2011, was back to his baseline.  There were no new issues.  He was considered clinically stable for discharge.  Apparently, he has had a Foley catheter in situ since discharge from hospitalization on September 26, 2011.  This has caused pressure and excoriation to the  external urethral meatus.  The Foley catheter was discontinued on July 22, 2011.  The patient has been placed on a 7-day course of Levaquin for possible infection, as well as topical Lotrimin AF.  The patient was discharged on October 23, 2011.  ACTIVITY:  As tolerated.  DIET:  Heart healthy/carbohydrate modified.  FOLLOWUP  INSTRUCTIONS:  The patient will follow up routinely with his primary MD, Dr. Karlene Einstein, with his primary urologist, Dr. Jerilee Field, and his primary cardiologist, Dr. Donato Schultz.     Isidor Holts, M.D.     CO/MEDQ  D:  10/23/2011  T:  10/23/2011  Job:  [id number]  cc:   Karlene Einstein, M.D. Jake Bathe, MD Jerilee Field, MD  Electronically Signed by Isidor Holts M.D. on 10/23/2011 06:44:06 PM

## 2011-10-24 ENCOUNTER — Encounter: Payer: Self-pay | Admitting: Vascular Surgery

## 2011-10-25 ENCOUNTER — Ambulatory Visit (INDEPENDENT_AMBULATORY_CARE_PROVIDER_SITE_OTHER): Payer: Medicare Other | Admitting: Vascular Surgery

## 2011-10-25 ENCOUNTER — Emergency Department (HOSPITAL_COMMUNITY): Payer: Medicare Other

## 2011-10-25 ENCOUNTER — Encounter: Payer: Self-pay | Admitting: Vascular Surgery

## 2011-10-25 ENCOUNTER — Inpatient Hospital Stay (HOSPITAL_COMMUNITY)
Admission: EM | Admit: 2011-10-25 | Discharge: 2011-10-30 | DRG: 092 | Disposition: A | Discharge status: Skilled Nursing Facility | Payer: Medicare Other | Attending: Internal Medicine | Admitting: Internal Medicine

## 2011-10-25 VITALS — BP 94/61 | HR 61

## 2011-10-25 DIAGNOSIS — I739 Peripheral vascular disease, unspecified: Secondary | ICD-10-CM | POA: Insufficient documentation

## 2011-10-25 DIAGNOSIS — Z950 Presence of cardiac pacemaker: Secondary | ICD-10-CM | POA: Diagnosis present

## 2011-10-25 DIAGNOSIS — I442 Atrioventricular block, complete: Secondary | ICD-10-CM | POA: Diagnosis present

## 2011-10-25 DIAGNOSIS — I69359 Hemiplegia and hemiparesis following cerebral infarction affecting unspecified side: Secondary | ICD-10-CM

## 2011-10-25 DIAGNOSIS — I7025 Atherosclerosis of native arteries of other extremities with ulceration: Secondary | ICD-10-CM | POA: Diagnosis present

## 2011-10-25 DIAGNOSIS — I509 Heart failure, unspecified: Secondary | ICD-10-CM | POA: Diagnosis present

## 2011-10-25 DIAGNOSIS — E1122 Type 2 diabetes mellitus with diabetic chronic kidney disease: Secondary | ICD-10-CM | POA: Diagnosis present

## 2011-10-25 DIAGNOSIS — L98499 Non-pressure chronic ulcer of skin of other sites with unspecified severity: Secondary | ICD-10-CM

## 2011-10-25 DIAGNOSIS — N390 Urinary tract infection, site not specified: Secondary | ICD-10-CM | POA: Diagnosis present

## 2011-10-25 DIAGNOSIS — E162 Hypoglycemia, unspecified: Secondary | ICD-10-CM | POA: Diagnosis present

## 2011-10-25 LAB — URINALYSIS, ROUTINE W REFLEX MICROSCOPIC
Protein, ur: 30 mg/dL — AB
Specific Gravity, Urine: 1.012 (ref 1.005–1.030)
Urobilinogen, UA: 1 mg/dL (ref 0.0–1.0)

## 2011-10-25 LAB — COMPREHENSIVE METABOLIC PANEL
AST: 95 U/L — ABNORMAL HIGH (ref 0–37)
Albumin: 2.4 g/dL — ABNORMAL LOW (ref 3.5–5.2)
Alkaline Phosphatase: 87 U/L (ref 39–117)
BUN: 34 mg/dL — ABNORMAL HIGH (ref 6–23)
CO2: 27 mEq/L (ref 19–32)
Chloride: 100 mEq/L (ref 96–112)
GFR calc non Af Amer: 55 mL/min — ABNORMAL LOW (ref >90)
Potassium: 5.2 mEq/L — ABNORMAL HIGH (ref 3.5–5.1)
Total Bilirubin: 0.4 mg/dL (ref 0.3–1.2)

## 2011-10-25 LAB — CBC
HCT: 29.4 % — ABNORMAL LOW (ref 39.0–52.0)
Hemoglobin: 9.2 g/dL — ABNORMAL LOW (ref 13.0–17.0)
MCV: 88 fL (ref 78.0–100.0)
RBC: 3.34 MIL/uL — ABNORMAL LOW (ref 4.22–5.81)
RDW: 14.7 % (ref 11.5–15.5)
WBC: 16.3 10*3/uL — ABNORMAL HIGH (ref 4.0–10.5)

## 2011-10-25 LAB — DIFFERENTIAL
Basophils Absolute: 0 10*3/uL (ref 0.0–0.1)
Eosinophils Relative: 2 % (ref 0–5)
Lymphocytes Relative: 10 % — ABNORMAL LOW (ref 12–46)
Lymphs Abs: 1.6 10*3/uL (ref 0.7–4.0)
Neutro Abs: 12.5 10*3/uL — ABNORMAL HIGH (ref 1.7–7.7)
Neutrophils Relative %: 76 % (ref 43–77)

## 2011-10-25 LAB — TROPONIN I: Troponin I: <0.3 ng/mL (ref <0.30)

## 2011-10-25 LAB — PROTIME-INR
INR: 1.32 (ref 0.00–1.49)
Prothrombin Time: 16.6 seconds — ABNORMAL HIGH (ref 11.6–15.2)

## 2011-10-25 LAB — URINE MICROSCOPIC-ADD ON

## 2011-10-25 NOTE — Progress Notes (Signed)
[facility]  Patient name: [name] MRN: [id number] DOB: [birth year] Sex: male  CC: evaluate left heel ulcer referred by Dr.Robson  HPI: [name] is a 77 y.o. male who is had a chronic heel ulcer on the left leg according to the discharge summary dated 10/23/2011. Owing to the patient's daughter the patient has had this heel decubitus for at least a month. His activities been very limited and recently he has been nonambulatory. He was hospitalized earlier this month with a transient ischemic attack and according to the discharge summary was evaluated by Dr.Early and not felt to be a candidate for revascularization.  The patient is currently nonambulatory. I do not get any history of claudication or rest pain. He has multiple medical comorbidities including history of complete heart block for which he has a permanent pacemaker. He also has systolic congestive heart failure with ejection fraction of 20-25%.  Past Medical History  Diagnosis Date  . Diabetes mellitus   . Hypertension   . CHF (congestive heart failure)   . CAD (coronary artery disease)   . Pacemaker   . TIA (transient ischemic attack)   . Stroke   . Hyperlipidemia   . Spinal stenosis   . DJD (degenerative joint disease)   . Peripheral vascular disease   . Hemorrhoids   . Carotid artery occlusion     History reviewed. No pertinent family history.  SOCIAL HISTORY: History  Substance Use Topics  . Smoking status: Former Games developer  . Smokeless tobacco: Not on file  . Alcohol Use: Not on file    No Known Allergies  Current Outpatient Prescriptions  Medication Sig Dispense Refill  . aspirin EC 81 MG tablet Take 81 mg by mouth daily.        Marland Kitchen atorvastatin (LIPITOR) 20 MG tablet Take 20 mg by mouth daily.        . carvedilol (COREG) 12.5 MG tablet Take 12.5 mg by mouth 2 (two) times daily with a meal.        . cephALEXin (KEFLEX) 500 MG capsule Take 500 mg by mouth 3 (three)  times daily. Take 1 cap three time/day x 21 days       . clopidogrel (PLAVIX) 75 MG tablet Take 75 mg by mouth daily.        . collagenase (SANTYL) ointment Apply 1 application topically 2 (two) times daily.        . finasteride (PROSCAR) 5 MG tablet Take 5 mg by mouth daily.        . furosemide (LASIX) 40 MG tablet Take 40 mg by mouth daily.        . insulin aspart (NOVOLOG) 100 UNIT/ML injection Inject into the skin 3 (three) times daily before meals.        . insulin glargine (LANTUS) 100 UNIT/ML injection Inject 15 Units into the skin at bedtime.        Marland Kitchen levofloxacin (LEVAQUIN) 500 MG tablet Take 500 mg by mouth daily.        Marland Kitchen lisinopril (PRINIVIL,ZESTRIL) 2.5 MG tablet Take 5 mg by mouth daily.        . metaxalone (SKELAXIN) 800 MG tablet Take 800 mg by mouth 3 (three) times daily as needed.       . mirtazapine (REMERON) 7.5 MG tablet Take 7.5 mg by mouth at bedtime.        . Multiple Vitamins-Minerals (ICAPS) CAPS Take 1 capsule by mouth daily.        Marland Kitchen  oxyCODONE-acetaminophen (PERCOCET) 5-325 MG per tablet Take 2 tablets by mouth every 6 (six) hours as needed.        . Sennosides (SENOKOT PO) Take 2 tablets by mouth as needed.        Marland Kitchen spironolactone (ALDACTONE) 25 MG tablet Take 25 mg by mouth daily.        . Tamsulosin HCl (FLOMAX) 0.4 MG CAPS Take 0.4 mg by mouth daily.        Marland Kitchen zinc sulfate 220 MG capsule Take 220 mg by mouth daily.          REVIEW OF SYSTEMS: Arly.[email or web address] ] denotes positive finding; [  ] denotes negative finding CARDIOVASCULAR:  [ ]  chest pain   [ ]  chest pressure   [ ]  palpitations   [ ]  orthopnea   Arly.Keller ] dyspnea on exertion   [ ]  claudication   [ ]  rest pain   [ ]  DVT   [ ]  phlebitis PULMONARY:   [ ]  productive cough   [ ]  asthma   [ ]  wheezing NEUROLOGIC:   [ ]  weakness  [ ]  paresthesias  [ ]  aphasia  [ ]  amaurosis  [ ]  dizziness HEMATOLOGIC:   [ ]  bleeding problems   [ ]  clotting disorders MUSCULOSKELETAL:  [ ]  joint pain   [ ]  joint swelling GASTROINTESTINAL: [ ]    blood in stool  [ ]   hematemesis GENITOURINARY:  [ ]   dysuria  [ ]   hematuria PSYCHIATRIC:  [ ]  history of major depression INTEGUMENTARY:  [ ]  rashes  [ ]  ulcers CONSTITUTIONAL:  [ ]  fever   [ ]  chills  PHYSICAL EXAM: Filed Vitals:   10/25/11 1310  BP: 94/61  Pulse: 61  SpO2: 98%   There is no height or weight on file to calculate BMI. GENERAL: The patient is a well-nourished male, in no acute distress. The vital signs are documented above. CARDIOVASCULAR: There is a regular rate and rhythm without significant murmur appreciated. I do not detect any carotid bruits. He has palpable femoral pulses and diminished but palpable popliteal pulse. I cannot palpate pedal pulses on either side. PULMONARY: There is good air exchange bilaterally without wheezing or rales. ABDOMEN: Soft and non-tender with normal pitched bowel sounds.  MUSCULOSKELETAL: There are no major deformities or cyanosis. NEUROLOGIC: No focal weakness or paresthesias are detected. SKIN: he has a full thickness heel decubitus on his left heel with no significant erythema or drainage associated with this. PSYCHIATRIC: The patient has a normal affect.  DATA:  I have reviewed his arterial Doppler study from the hospital which shows an ABI of 46% on the right and 58% on the left.  Carotid duplex scan in the hospital did not show any significant carotid disease.  2-D echo on October 20 612 showed an EF of 40-45% with diffuse hypokinesis.  Creatinine during his most recent admission was normal at 0.73.  MEDICAL ISSUES: Based on his exam he has evidence of significant bilateral infrainguinal arterial occlusive disease. I do not think that he has adequate circulation to heal his heel decubitus. I would expect that this wound gradually progressed and ultimately he would require amputation. Is some contracture in the knee on the left and would likely require an above-the-knee amputation. Given his debilitated state with multiple  medical comorbidities he is not an ideal candidate for revascularization and I think would be very high risk for this. I were we did discuss the option of proceeding with arteriography to see  if there were any endovascular options to improve circulation to the left foot. He would like to pursue this option and we can make further recommendations pending these results.  I have reviewed with the patient the indications for arteriography. In addition, I have reviewed the potential complications of arteriography including but not limited to: Bleeding, arterial injury, arterial thrombosis, dye action, renal insufficiency, or other unpredictable medical problems. I have explained to the patient that if we find disease amenable to angioplasty we could potentially address this at the same time. I have discussed the potential complications of angioplasty and stenting, including but not limited to: Bleeding, arterial thrombosis, arterial injury, dissection, or the need for surgical intervention.  This procedure is scheduled for 10/30/2011.  [name] S [facility] [place] Office: [phone]

## 2011-10-26 ENCOUNTER — Encounter (HOSPITAL_COMMUNITY): Payer: Self-pay | Admitting: Pharmacy Technician

## 2011-10-26 LAB — GLUCOSE, CAPILLARY
Glucose-Capillary: 148 mg/dL — ABNORMAL HIGH (ref 70–99)
Glucose-Capillary: 48 mg/dL — ABNORMAL LOW (ref 70–99)
Glucose-Capillary: 120 mg/dL — ABNORMAL HIGH (ref 70–99)

## 2011-10-26 LAB — PROTIME-INR: INR: 1.3 (ref 0.00–1.49)

## 2011-10-26 LAB — CBC
HCT: 26.2 % — ABNORMAL LOW (ref 39.0–52.0)
Hemoglobin: 8.4 g/dL — ABNORMAL LOW (ref 13.0–17.0)
RBC: 3 MIL/uL — ABNORMAL LOW (ref 4.22–5.81)
RDW: 14.6 % (ref 11.5–15.5)
WBC: 13.5 10*3/uL — ABNORMAL HIGH (ref 4.0–10.5)

## 2011-10-26 LAB — CARDIAC PANEL(CRET KIN+CKTOT+MB+TROPI)
CK, MB: 9.7 ng/mL (ref 0.3–4.0)
CK, MB: 7.3 ng/mL (ref 0.3–4.0)
Relative Index: 2.8 — ABNORMAL HIGH (ref 0.0–2.5)
Total CK: 239 U/L — ABNORMAL HIGH (ref 7–232)
Total CK: 259 U/L — ABNORMAL HIGH (ref 7–232)
Troponin I: <0.3 ng/mL (ref <0.30)
Troponin I: <0.3 ng/mL (ref <0.30)

## 2011-10-26 LAB — APTT: aPTT: 43 seconds — ABNORMAL HIGH (ref 24–37)

## 2011-10-26 LAB — LIPID PANEL: LDL Cholesterol: 50 mg/dL (ref 0–99)

## 2011-10-26 LAB — MRSA PCR SCREENING: MRSA by PCR: NEGATIVE

## 2011-10-26 LAB — HEPARIN LEVEL (UNFRACTIONATED): Heparin Unfractionated: 0.1 IU/mL — ABNORMAL LOW (ref 0.30–0.70)

## 2011-10-27 LAB — GLUCOSE, CAPILLARY
Glucose-Capillary: 109 mg/dL — ABNORMAL HIGH (ref 70–99)
Glucose-Capillary: 183 mg/dL — ABNORMAL HIGH (ref 70–99)

## 2011-10-27 LAB — URINE CULTURE: Culture  Setup Time: 201210312125

## 2011-10-28 LAB — GLUCOSE, CAPILLARY
Glucose-Capillary: 115 mg/dL — ABNORMAL HIGH (ref 70–99)
Glucose-Capillary: 191 mg/dL — ABNORMAL HIGH (ref 70–99)

## 2011-10-28 MED ORDER — ONDANSETRON HCL 4 MG PO TABS: 4.0000 mg | ORAL_TABLET | ORAL | Status: DC | PRN

## 2011-10-28 MED ORDER — CEFUROXIME AXETIL 500 MG PO TABS
500.0000 mg | ORAL_TABLET | Freq: Two times a day (BID) | ORAL | Status: DC
  Administered 2011-10-29 – 2011-10-30 (×3): 500 mg via ORAL
  Filled 2011-10-28 (×6): qty 1

## 2011-10-28 MED ORDER — ASPIRIN 300 MG RE SUPP
300.0000 mg | Freq: Every day | RECTAL | Status: DC | PRN
  Filled 2011-10-28: qty 1

## 2011-10-28 MED ORDER — INSULIN ASPART 100 UNIT/ML ~~LOC~~ SOLN
0.0000 [IU] | Freq: Three times a day (TID) | SUBCUTANEOUS | Status: DC
  Administered 2011-10-29: 1 [IU] via SUBCUTANEOUS
  Administered 2011-10-29: 0 [IU] via SUBCUTANEOUS
  Administered 2011-10-29: 1 [IU] via SUBCUTANEOUS
  Filled 2011-10-28: qty 3

## 2011-10-28 MED ORDER — LISINOPRIL 5 MG PO TABS
5.0000 mg | ORAL_TABLET | Freq: Every day | ORAL | Status: DC
  Administered 2011-10-29 – 2011-10-30 (×2): 5 mg via ORAL
  Filled 2011-10-28 (×3): qty 1

## 2011-10-28 MED ORDER — ACETAMINOPHEN 325 MG PO TABS
650.0000 mg | ORAL_TABLET | ORAL | Status: DC | PRN
Start: 2011-10-28 — End: 2011-10-30
  Administered 2011-10-30: 650 mg via ORAL

## 2011-10-28 MED ORDER — CLOPIDOGREL BISULFATE 75 MG PO TABS
75.0000 mg | ORAL_TABLET | Freq: Every day | ORAL | Status: DC
  Administered 2011-10-29 – 2011-10-30 (×2): 75 mg via ORAL
  Filled 2011-10-28: qty 1

## 2011-10-28 MED ORDER — BISACODYL 10 MG RE SUPP: 10.0000 mg | Freq: Every day | RECTAL | Status: DC | PRN

## 2011-10-28 MED ORDER — FINASTERIDE 5 MG PO TABS
5.0000 mg | ORAL_TABLET | Freq: Every day | ORAL | Status: DC
  Administered 2011-10-29: 5 mg via ORAL
  Filled 2011-10-28 (×3): qty 1

## 2011-10-28 MED ORDER — SENNA 8.6 MG PO TABS
2.0000 | ORAL_TABLET | Freq: Every day | ORAL | Status: DC | PRN
  Filled 2011-10-28: qty 2

## 2011-10-28 MED ORDER — ACETAMINOPHEN 650 MG RE SUPP: 650.0000 mg | RECTAL | Status: DC | PRN

## 2011-10-28 MED ORDER — GLUCERNA SHAKE PO LIQD
237.0000 mL | Freq: Two times a day (BID) | ORAL | Status: DC
  Administered 2011-10-29 – 2011-10-30 (×3): 237 mL via ORAL

## 2011-10-28 MED ORDER — PROSOURCE NO CARB PO LIQD
30.0000 mL | Freq: Two times a day (BID) | ORAL | Status: DC
  Administered 2011-10-29 (×2): 30 mL via ORAL
  Filled 2011-10-28 (×7): qty 30

## 2011-10-28 MED ORDER — ROSUVASTATIN CALCIUM 10 MG PO TABS
10.0000 mg | ORAL_TABLET | Freq: Every day | ORAL | Status: DC
  Administered 2011-10-29: 10 mg via ORAL
  Filled 2011-10-28 (×3): qty 1

## 2011-10-28 MED ORDER — DOCUSATE SODIUM 100 MG PO CAPS
100.0000 mg | ORAL_CAPSULE | Freq: Two times a day (BID) | ORAL | Status: DC
  Administered 2011-10-29 – 2011-10-30 (×3): 100 mg via ORAL
  Filled 2011-10-28 (×6): qty 1

## 2011-10-28 MED ORDER — ONDANSETRON HCL 4 MG/2ML IJ SOLN: 4.0000 mg | INTRAMUSCULAR | Status: DC | PRN

## 2011-10-28 MED ORDER — MIRTAZAPINE 15 MG PO TABS
15.0000 mg | ORAL_TABLET | Freq: Every day | ORAL | Status: DC
Start: 2011-10-28 — End: 2011-10-30
  Administered 2011-10-29: 15 mg via ORAL
  Filled 2011-10-28 (×3): qty 1

## 2011-10-28 MED ORDER — INSULIN GLARGINE 100 UNIT/ML ~~LOC~~ SOLN
5.0000 [IU] | Freq: Every day | SUBCUTANEOUS | Status: DC
  Administered 2011-10-29: 5 [IU] via SUBCUTANEOUS
  Filled 2011-10-28: qty 3

## 2011-10-28 MED ORDER — MORPHINE SULFATE 2 MG/ML IJ SOLN: 1.0000 mg | INTRAMUSCULAR | Status: DC | PRN

## 2011-10-28 MED ORDER — ASPIRIN 81 MG PO CHEW
81.0000 mg | CHEWABLE_TABLET | Freq: Every day | ORAL | Status: DC
  Administered 2011-10-29 – 2011-10-30 (×2): 81 mg via ORAL
  Filled 2011-10-28: qty 1

## 2011-10-28 MED ORDER — CARVEDILOL 12.5 MG PO TABS
12.5000 mg | ORAL_TABLET | Freq: Two times a day (BID) | ORAL | Status: DC
  Administered 2011-10-29 – 2011-10-30 (×3): 12.5 mg via ORAL
  Filled 2011-10-28 (×6): qty 1

## 2011-10-28 MED ORDER — METAXALONE 800 MG PO TABS
800.0000 mg | ORAL_TABLET | Freq: Three times a day (TID) | ORAL | Status: DC | PRN
  Filled 2011-10-28: qty 1

## 2011-10-28 MED ORDER — INSULIN ASPART 100 UNIT/ML ~~LOC~~ SOLN
0.0000 [IU] | Freq: Every day | SUBCUTANEOUS | Status: DC
  Filled 2011-10-28: qty 3

## 2011-10-28 MED ORDER — OXYCODONE HCL 5 MG PO TABS
5.0000 mg | ORAL_TABLET | ORAL | Status: DC | PRN
  Administered 2011-10-29 – 2011-10-30 (×2): 5 mg via ORAL

## 2011-10-28 MED ORDER — TAMSULOSIN HCL 0.4 MG PO CAPS
0.4000 mg | ORAL_CAPSULE | Freq: Every day | ORAL | Status: DC
  Administered 2011-10-29 – 2011-10-30 (×2): 0.4 mg via ORAL
  Filled 2011-10-28 (×3): qty 1

## 2011-10-28 NOTE — H&P (Signed)
[name], [name]:  [id number]  MEDICAL RECORD NO.:  [id number]  LOCATION:  [facility]                         FACILITY:  [facility]  PHYSICIAN:  Vania Rea, M.D. DATE OF BIRTH:  [birth year]  DATE OF ADMISSION:  10/25/2011 DATE OF DISCHARGE:                             HISTORY & PHYSICAL   PRIMARY CARE PHYSICIAN:  Karlene Einstein, MD with [facility].  CHIEF COMPLAINT:  Slurring of speech since this afternoon.  HISTORY OF PRESENT ILLNESS:  This is a 77 year old African American gentleman who was discharged from the Hospitalist Service 2 days ago after a 4-day hospital stay during which he was evaluated for a transient ischemic attack and peripheral vascular disease.  The patient's symptoms had resolved by the time of discharge; however, this afternoon while eating, the patient was noted to be having slurring of speech and difficulty manipulating the food in his mouth.  He was brought to the emergency room, but by the time of arrival in the emergency room, the slurring of speech and facial weakness resolved, but before he could be discharged, the symptoms recurred again, he was evaluated by the neurologist for recurrent right-sided facial droop and slurring of speech, was not considered a candidate for tPA, and admission by the Hospitalist was recommended.  The patient denies any fever, cough, or cold.  He denies chest pains or shortness of breath.  He has been having burning with urination and has difficult to passing urine.  He in fact visited his urologist today who by the patient's report did not find any abnormality; however, the patient has been unable to pass any urine while in the emergency room, although his bladder seems full.  He reports that he has a mild left- sided weakness, which is chronic from a previous stroke.  He has not been walking for many months now due to an ulcer of his left heel and having been bed confined for some  time.  He has developed some contractures around his hip and knees and has pain when he tries to extend his knee or hip.  PAST MEDICAL HISTORY: 1. Transient ischemic attack, discharge 2 days ago. 2. History of stroke with mild left hemiparesis, residual. 3. History of recent dehydration with recent discontinuation of his     diuretic. 4. Diabetes type 2. 5. Dyslipidemia. 6. Hypertension. 7. Chronic systolic heart failure.  Ejection fraction 20-25%. 8. Status post permanent pacemaker for complete heart block. 9. History of spinal stenosis of lumbosacral degenerative joint     disease. 10.Chronic left heel decubitus ulcer. 11.Peripheral vascular disease. 12.Not a candidate for revascularization surgery.  MEDICATIONS:  His MedRec has not yet been done, but his medications include: 1. Lantus 15 units at bedtime with sliding scale NovoLog. 2. Levaquin 500 mg daily, scheduled to end on October 30, 2011. 3. Lotrimin cream to be applied to his glans penis. 4. Aldactone 25 mg daily. 5. Boost 1 can 3 times daily before meals. 6. Aspirin 81 mg daily. 7. Carvedilol 12.5 mg twice daily. 8. Finasteride 5 mg daily. 9. Lipitor 20 mg at bedtime. 10.Flomax 0.4 mg daily. 11.Lisinopril 5 mg daily. 12.Percocet 5/325 2 tablets every 4 hours p.r.n. for pain. 13.Plavix  75 mg daily. 14.Prostat 30 mg twice daily. 15.Remeron 7.5 mg at bedtime. 16.Skelaxin 800 mg 3 times daily p.r.n. for muscle spasms. 17.Senokot 2 tablets daily p.r.n. for constipation. 18.Zinc oxide 220 mg daily. 19.I-CAPS over-the-counter 1 daily. 20.Santyl ointment 1 application topically twice daily to the left     heel wound.  ALLERGIES:  No known drug allergies.  SOCIAL HISTORY:  He is a resident of [place].  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  REVIEW OF SYSTEMS:  Other than noted above, unremarkable.  PHYSICAL EXAMINATION:  GENERAL:  Pleasant, elderly, African  American gentleman, mildly disoriented to place and time, but were of himself and the reason for being here. VITAL SIGNS:  Temperature is 97.8, his pulse is 67, respirations 20, blood pressure 138/60, he is saturating 100% on 2 L. HEENT:  His pupils are round and equal.  Mucous membranes are pale, anicteric.  Mild dehydration. LYMPHATICS:  No cervical lymphadenopathy. NECK:  He does have right-sided facial droop.  No JVD, no thyromegaly, no lymphadenopathy. CHEST:  Clear to auscultation bilaterally. CARDIOVASCULAR:  Regular rhythm.  No murmurs. ABDOMEN:  Soft, nontender.  He has suprapubic fullness and tenderness, but no masses. EXTREMITIES:  Held in flexion contractures at the knees and hips.  There is no tenderness of the hip, although he complains of pain.  There is no edema. SKIN:  Very dry.  He has a bandaged ulcer of the left heel, both feet are in protective boots.  He does have arthritic changes of the knees. He does have wasting of the small muscles of his right hand in particular. CENTRAL NERVOUS SYSTEM:  Although he has drooping of the left side of his face, the muscle of his face actually seems quite strong when tested individually.  Cranial nerves II-XII appear grossly intact, and he is able to move both upper limbs equally.  His moves his lower limbs with difficulty because of pain and contractures.  LABORATORY DATA:  His white count is 16.3, up from 12.5 at discharge. His hemoglobin is 9.2, which is above the level at discharge.  His absolute neutrophil count is 12.5, his monocyte is count 1.8.  His sodium is 136, potassium 5.2 with slight hemolysis, chloride 100, CO2 27, glucose 190, BUN 34, creatinine 1.19.  AST elevated at 95 slight hemolysis, ALT 83, total protein 7.4, albumin 2.4, calcium 9.3.  Cardiac enzymes; his total CK is 222, CK-MB is elevated at 9.5, relative index 4.3.  His troponin is undetectable.  His PT is elevated at 16.1, INR is 1.32, PTT is normal  at 35.  His CT scan of his head without contrast showed no acute or reversible changes, atrophy and chronic small-vessel disease.  There is an old cerebellar infarction on the right.  There is atherosclerotic calcification of the major vessels of the base of the brain.  Chest x-ray shows cardiac enlargement and vascular congestion without overt pulmonary edema.  EKG shows a paced rhythm with no obvious changes from previous EKG.  ASSESSMENT: 1. Recurring transient ischemic attacks in an elderly gentleman with     known severe vasculopathy. 2. Abnormal question of cardiac enzymes, questionable non-ST elevation     myocardial infarction. 3. Diabetes type 2, uncontrolled. 4. Degenerative joint disease. 5. Chronic systolic heart failure. 6. Abnormal liver functions are questionable related to acute     myocardial infarction. 7. Coagulopathy questionable related to liver disease. 8. Hypertension, controlled. 9. Dyslipidemia controlled by recent labs.  PLAN: 1. We will admit this gentleman to the Neuro-Tele Unit.  We will     continue to cycle his cardiac enzymes.  We will give low-dose IV     fluids in view of his history of congestive heart failure.  We will     consult cardiologist for assistance with management.  Neurology     Service have already been consulted. 2. We will continue his Levaquin pending the results of his urine     evaluation. 3. Other plans as per orders.     Vania Rea, M.D.     LC/MEDQ  D:  10/25/2011  T:  10/25/2011  Job:  [id number]  cc:   Karlene Einstein, M.D.  Electronically Signed by Vania Rea M.D. on 10/28/2011 12:27:12 PM

## 2011-10-29 DIAGNOSIS — N390 Urinary tract infection, site not specified: Secondary | ICD-10-CM | POA: Diagnosis present

## 2011-10-29 DIAGNOSIS — I442 Atrioventricular block, complete: Secondary | ICD-10-CM | POA: Diagnosis present

## 2011-10-29 DIAGNOSIS — E162 Hypoglycemia, unspecified: Secondary | ICD-10-CM | POA: Diagnosis present

## 2011-10-29 DIAGNOSIS — Z950 Presence of cardiac pacemaker: Secondary | ICD-10-CM | POA: Diagnosis present

## 2011-10-29 DIAGNOSIS — I69359 Hemiplegia and hemiparesis following cerebral infarction affecting unspecified side: Secondary | ICD-10-CM

## 2011-10-29 DIAGNOSIS — I509 Heart failure, unspecified: Secondary | ICD-10-CM | POA: Diagnosis present

## 2011-10-29 DIAGNOSIS — E1122 Type 2 diabetes mellitus with diabetic chronic kidney disease: Secondary | ICD-10-CM | POA: Diagnosis present

## 2011-10-29 LAB — GLUCOSE, CAPILLARY
Glucose-Capillary: 166 mg/dL — ABNORMAL HIGH (ref 70–99)
Glucose-Capillary: 120 mg/dL — ABNORMAL HIGH (ref 70–99)
Glucose-Capillary: 129 mg/dL — ABNORMAL HIGH (ref 70–99)
Glucose-Capillary: 147 mg/dL — ABNORMAL HIGH (ref 70–99)
Glucose-Capillary: 164 mg/dL — ABNORMAL HIGH (ref 70–99)

## 2011-10-29 LAB — POTASSIUM: Potassium: 3.8 mEq/L (ref 3.5–5.1)

## 2011-10-29 LAB — MAGNESIUM: Magnesium: 2 mg/dL (ref 1.5–2.5)

## 2011-10-29 MED ORDER — BACLOFEN 10 MG PO TABS
10.0000 mg | ORAL_TABLET | Freq: Three times a day (TID) | ORAL | Status: DC
  Administered 2011-10-29 – 2011-10-30 (×4): 10 mg via ORAL
  Filled 2011-10-29 (×6): qty 1

## 2011-10-29 NOTE — Progress Notes (Signed)
Subjective: No new events. Confusion has resolved. C/o severe pain in the left leg due to contractures.  Objective: Weight change:   Intake/Output Summary (Last 24 hours) at 10/29/11 1335 Last data filed at 10/29/11 1300  Gross per 24 hour  Intake   1080 ml  Output    550 ml  Net    530 ml   General appearance: cooperative, appears older than stated age and cachectic. Lungs - clear, cor regular, S3 gallop, contracted left lower extremity with pain . Old left hemiparesis, alert and oriented times 3..  Lab Results: No labs today   Micro Results: Recent Results (from the past 240 hour(s))  URINE CULTURE     Status: Normal   Collection Time   10/19/11  3:32 PM      Component Value Range Status Comment   Specimen Description URINE, RANDOM   Final    Special Requests NONE   Final    Setup Time [id number]   Final    Colony Count NO GROWTH   Final    Culture NO GROWTH   Final    Report Status 10/20/2011 FINAL   Final   URINE CULTURE     Status: Normal   Collection Time   10/25/11  8:46 PM      Component Value Range Status Comment   Specimen Description URINE, CLEAN CATCH   Final    Special Requests NONE   Final    Setup Time [id number]   Final    Colony Count >=100,000 COLONIES/ML   Final    Culture ESCHERICHIA COLI   Final    Report Status 10/27/2011 FINAL   Final    Organism ID, Bacteria ESCHERICHIA COLI   Final   MRSA PCR SCREENING     Status: Normal   Collection Time   10/25/11 11:37 PM      Component Value Range Status Comment   MRSA by PCR NEGATIVE  NEGATIVE  Final     Studies/Results: Dg Chest 1 View  10/19/2011  *RADIOLOGY REPORT*  Clinical Data: Weakness.  CHEST - 1 VIEW  Comparison: Chest x-ray 09/22/2011.  Findings: The pacer wires are stable.  Mild cardiac enlargement. No acute pulmonary findings.  Left basilar scarring.  IMPRESSION:  1.  Stable cardiac enlargement. 2.  No acute pulmonary findings.  Original Report Authenticated By: P. Loralie Champagne, M.D.     Dg Hip Complete Left  10/19/2011  *RADIOLOGY REPORT*  Clinical Data: Left hip pain.  LEFT HIP - COMPLETE 2+ VIEW  Comparison: None  Findings: Both hips are normally located.  There are moderate degenerative changes.  The pubic symphysis and SI joints are intact.  No definite acute pelvic fractures.  IMPRESSION: Degenerative changes but no obvious acute fracture.  Original Report Authenticated By: P. Loralie Champagne, M.D.   Ct Head Wo Contrast  10/25/2011  *RADIOLOGY REPORT*  Clinical Data: Dysphagia.  Code stroke.  Left-sided facial droop.  CT HEAD WITHOUT CONTRAST  Technique:  Contiguous axial images were obtained from the base of the skull through the vertex without contrast.  Comparison: 10/19/2011  Findings: The brainstem is unremarkable.  There is an old cerebellar infarction on the right.  The cerebral hemispheres show atrophy with extensive chronic small vessel change affecting the deep white matter.  No sign of acute infarction, mass lesion, hemorrhage, hydrocephalus or extra-axial collection.  There is atherosclerotic calcification of the major vessels at the base of the brain.  IMPRESSION: No acute or reversible findings.  Atrophy  and chronic small vessel disease.  No appreciable change since 6 days ago.  Critical Value/emergent results were called by telephone at the time of interpretation on 10/25/2011  at 1850 hours  to   [facility] EDP, who verbally acknowledged these results.  Original Report Authenticated By: Thomasenia Sales, M.D.   Ct Head Wo Contrast  10/19/2011  *RADIOLOGY REPORT*  Clinical Data: Left facial droop.  Renal failure and diabetes and hypertension.  CT HEAD WITHOUT CONTRAST  Technique:  Contiguous axial images were obtained from the base of the skull through the vertex without contrast.  Comparison: None.  Findings: Generalized atrophy.  Hypodensity in the periventricular white matter, typical for chronic microvascular ischemia.  No acute infarct.  Negative for hemorrhage  or mass lesion.  Atherosclerotic disease in the carotid and vertebral arteries. Calvarium is intact.  IMPRESSION: Atrophy and chronic microvascular ischemia.  No acute abnormality.  Original Report Authenticated By: Camelia Phenes, M.D.   Ct Lumbar Spine Wo Contrast  10/20/2011  *RADIOLOGY REPORT*  Clinical Data: 77 year old male with radiculopathy, possible lumbar stenosis.  CT LUMBAR SPINE WITHOUT CONTRAST  Technique:  Multidetector CT imaging of the lumbar spine was performed without intravenous contrast administration. Multiplanar CT image reconstructions were also generated.  Comparison: CT abdomen and pelvis 09/20/2011.  Findings: Cardiac pacemaker leads partially visualized.  Stable visualized abdominal viscera including extensive calcified atherosclerosis, exophytic left renal cyst, and there has been some increase in the volume of retained stool in the colon.  Prior surgery to the L3-L4 and L4-L5 levels, see details below. Bone mineralization is within normal limits for age.  Degenerative ankylosis of the left SI joint.  Visualized sacrum is intact.  T11-T12:  Right far lateral disc osteophyte complex.  Mild ligament flavum hypertrophy.  No spinal or foraminal stenosis.  T12-L1:  Mild calcified circumferential disc bulge.  No spinal or foraminal stenosis.  L1-L2:  Mild disc bulge.  Moderate facet hypertrophy greater on the right.  Bilateral vacuum facet phenomena.  No spinal stenosis. Mild right L1 foraminal stenosis.  L2-L3:  Mild to moderate circumferential disc bulge.  Moderate facet hypertrophy.  Vacuum facet phenomenon greater on the left. No spinal stenosis.  Mild bilateral L2 foraminal stenosis.  L3-L4:  Sequelae of decompression.  Severe residual facet hypertrophy. Vacuum facet hypertrophy on the left.  Circumferential disc bulge.  Ligament flavum hypertrophy greater on the right.  No spinal stenosis, but a degree of bilateral lateral recess stenosis is suspected.  There is also severe left and  moderate right bilateral L3 foraminal stenosis.  L4-L5:  Sequelae of decompression.  Anterolisthesis measuring 4-5 mm.  Very severe residual facet hypertrophy.  Circumferential disc osteophyte complex.  No spinal stenosis, but there could be a bilateral lateral recess stenosis.  There is severe bilateral L4 foraminal stenosis, worse on the left.  L5-S1:  Circumferential disc bulge.  Mild facet hypertrophy.  No spinal stenosis.  Moderate left and severe right L5 foraminal stenosis.  IMPRESSION: 1.  Previous decompression at L3-L4 and L4-L5.  - At L4-L5 there is grade 1 spondylolisthesis with very severe residual facet hypertrophy and multifactorial severe bilateral L4 foraminal stenosis and possible lateral recess stenosis. - At L3-L4 there is residual facet is and degeneration with moderate to severe bilateral L3 foraminal stenosis and possible lateral recess stenosis. 2. Moderate to severe multifactorial bilateral L5 foraminal stenosis. 3.  No lumbar spinal stenosis.  Comparatively mild for age disc and facet degeneration elsewhere in the lumbar spine.  Original  Report Authenticated By: Harley Hallmark, M.D.   Dg Chest Portable 1 View  10/25/2011  *RADIOLOGY REPORT*  Clinical Data: Shortness of breath, weakness and confusion.  PORTABLE CHEST - 1 VIEW  Comparison: Chest x-ray 10/19/2011.  Findings: The heart is enlarged but stable.  The pacer wires are stable.  There is central vascular congestion without overt to pulmonary edema.  No pleural effusions or focal infiltrates.  Bony thorax is intact.  IMPRESSION: Cardiac enlargement and vascular congestion without overt pulmonary edema.  Original Report Authenticated By: P. Loralie Champagne, M.D.   Medications: Scheduled Meds:   . aspirin  81 mg Oral Daily  . baclofen  10 mg Oral TID  . carvedilol  12.5 mg Oral BID WC  . cefUROXime  500 mg Oral BID WC  . clopidogrel  75 mg Oral Q breakfast  . docusate sodium  100 mg Oral BID  . feeding supplement  237 mL  Oral BID  . finasteride  5 mg Oral QHS  . insulin aspart  0-5 Units Subcutaneous QHS  . insulin aspart  0-9 Units Subcutaneous TID WC  . insulin glargine  5 Units Subcutaneous QHS  . lisinopril  5 mg Oral Daily  . mirtazapine  15 mg Oral QHS  . protein supplement  30 mL Oral BID  . rosuvastatin  10 mg Oral q1800  . Tamsulosin HCl  0.4 mg Oral Daily   Continuous Infusions:  PRN Meds:.acetaminophen, acetaminophen, aspirin, bisacodyl, metaxalone, morphine injection, ondansetron, ondansetron, oxyCODONE, senna  Assessment/Plan: Patient Active Problem List  Diagnoses  . Atherosclerosis of native arteries of the extremities with ulceration  . Hypoglycemia  . UTI (lower urinary tract infection)  . CVA, old, hemiparesis  . CHF, left ventricular  . Complete heart block  . Artificial pacemaker  . DM type 2 causing CKD stage 3  - improving in regards to confusion. No further hypoglycemia, getting antibiotics for UTI. Start baclofen for severe contractures. Waiting for new snf bed.  LOS: 4 days   [name] 10/29/2011, 1:35 PM

## 2011-10-29 NOTE — Progress Notes (Signed)
10/29/11 Nursing Notes - Pt had 15 bts vtach on telemetry at 0439.  Pt awakened from sleep without complaints, BP 178/72 HR58, O2 sats 97 room air.  K level on 10/25/11 was 5.2.  Lenny Pastel notified.  Strip posted in chart.  Westley Chandler, Charity fundraiser.

## 2011-10-30 ENCOUNTER — Ambulatory Visit (HOSPITAL_COMMUNITY): Admission: RE | Admit: 2011-10-30 | Payer: Medicare Other | Source: Ambulatory Visit | Admitting: Surgery

## 2011-10-30 ENCOUNTER — Encounter (HOSPITAL_COMMUNITY)
Admission: EM | Disposition: A | Discharge status: Skilled Nursing Facility | Payer: Self-pay | Source: Home / Self Care | Attending: Internal Medicine

## 2011-10-30 DIAGNOSIS — I70269 Atherosclerosis of native arteries of extremities with gangrene, unspecified extremity: Secondary | ICD-10-CM

## 2011-10-30 HISTORY — PX: ABDOMINAL AORTAGRAM: SHX5454

## 2011-10-30 LAB — CBC
MCH: 28.5 pg (ref 26.0–34.0)
MCHC: 32 g/dL (ref 30.0–36.0)
Platelets: 387 10*3/uL (ref 150–400)
RDW: 15 % (ref 11.5–15.5)

## 2011-10-30 LAB — BASIC METABOLIC PANEL
Calcium: 8.9 mg/dL (ref 8.4–10.5)
GFR calc Af Amer: >90 mL/min (ref >90)
GFR calc non Af Amer: 85 mL/min — ABNORMAL LOW (ref >90)
Glucose, Bld: 145 mg/dL — ABNORMAL HIGH (ref 70–99)
Sodium: 140 mEq/L (ref 135–145)

## 2011-10-30 SURGERY — ABDOMINAL AORTAGRAM
Anesthesia: LOCAL

## 2011-10-30 MED ORDER — DSS 100 MG PO CAPS: 100.0000 mg | ORAL_CAPSULE | Freq: Two times a day (BID) | ORAL | Status: AC

## 2011-10-30 MED ORDER — BACLOFEN 10 MG PO TABS: 10.0000 mg | ORAL_TABLET | Freq: Three times a day (TID) | ORAL | Status: DC

## 2011-10-30 MED ORDER — INSULIN GLARGINE 100 UNIT/ML ~~LOC~~ SOLN: 5.0000 [IU] | Freq: Every day | SUBCUTANEOUS | Status: DC

## 2011-10-30 MED ORDER — INSULIN ASPART 100 UNIT/ML ~~LOC~~ SOLN
0.0000 [IU] | Freq: Three times a day (TID) | SUBCUTANEOUS | Status: DC
  Administered 2011-10-30: 2 [IU] via SUBCUTANEOUS
  Administered 2011-10-30: 1 [IU] via SUBCUTANEOUS

## 2011-10-30 MED ORDER — PRO-STAT SUGAR FREE PO LIQD
30.0000 mL | Freq: Two times a day (BID) | ORAL | Status: DC
  Filled 2011-10-30 (×5): qty 30

## 2011-10-30 MED ORDER — ACETAMINOPHEN 325 MG PO TABS: 650.0000 mg | ORAL_TABLET | ORAL | Status: DC | PRN

## 2011-10-30 MED ORDER — CEFUROXIME AXETIL 500 MG PO TABS: 500.0000 mg | ORAL_TABLET | Freq: Two times a day (BID) | ORAL | Status: AC

## 2011-10-30 NOTE — Discharge Summary (Signed)
Physician Discharge Summary  Patient ID: [name] MRN: [id number] DOB/AGE: 75-Oct-1928 75 y.o. Primary Care Physician:ROBSON,MICHAEL GAVIN, MD Admit date: 10/25/2011 Discharge date: 10/30/2011    Discharge Diagnoses:  #1 toxic metabolic encephalopathy due to combination of hypoglycemia and urinary tract infection resolved #2  Atherosclerosis of native arteries of the extremities with ulceration - the patient will have arteriogram as an outpatient  # 3 UTI (lower urinary tract infection)  # 4 CVA, old, hemiparesis  # 5 CHF, left ventricular - chronic without decompensation   # 6 Complete heart block s/p  Artificial pacemaker # 7  DM type 2 causing CKD stage 3   Current Discharge Medication List    START taking these medications   Details  acetaminophen (TYLENOL) 325 MG tablet Take 2 tablets (650 mg total) by mouth every 4 (four) hours as needed (pain or T>=100.5). Qty: 30 tablet, Refills: 0    baclofen (LIORESAL) 10 MG tablet Take 1 tablet (10 mg total) by mouth 3 (three) times daily. Qty: 30 each    cefUROXime (CEFTIN) 500 MG tablet Take 1 tablet (500 mg total) by mouth 2 (two) times daily with a meal. Qty: 8 tablet, Refills: 0    docusate sodium 100 MG CAPS Take 100 mg by mouth 2 (two) times daily. Qty: 10 capsule      CONTINUE these medications which have CHANGED   Details  insulin glargine (LANTUS) 100 UNIT/ML injection Inject 5 Units into the skin at bedtime. Qty: 10 mL      CONTINUE these medications which have NOT CHANGED   Details  aspirin 81 MG chewable tablet Chew 81 mg by mouth daily.      atorvastatin (LIPITOR) 20 MG tablet Take 20 mg by mouth at bedtime.     carvedilol (COREG) 12.5 MG tablet Take 12.5 mg by mouth 2 (two) times daily with a meal.      clopidogrel (PLAVIX) 75 MG tablet Take 75 mg by mouth daily.     finasteride (PROSCAR) 5 MG tablet Take 5 mg by mouth at bedtime.     insulin aspart (NOVOLOG) 100 UNIT/ML injection Inject 1-9 Units  into the skin 3 (three) times daily with meals.     levofloxacin (LEVAQUIN) 500 MG tablet Take 500 mg by mouth daily. 7 day course. Started on 10/23/11    lisinopril (PRINIVIL,ZESTRIL) 5 MG tablet Take 5 mg by mouth daily.      metaxalone (SKELAXIN) 800 MG tablet Take 800 mg by mouth 3 (three) times daily as needed. For muscle spasms    mirtazapine (REMERON) 7.5 MG tablet Take 7.5 mg by mouth at bedtime.     Multiple Vitamins-Minerals (ICAPS) CAPS Take 1 capsule by mouth daily.     !! Nutritional Supplements (BOOST GLUCOSE CONTROL) LIQD Take 1 Can by mouth 3 (three) times daily with meals.      oxyCODONE-acetaminophen (PERCOCET) 5-325 MG per tablet Take 2 tablets by mouth every 4 (four) hours as needed. For pain    !! protein supplement (PROSOURCE NO CARB) LIQD Take 30 mLs by mouth 2 (two) times daily.      Sennosides (SENOKOT PO) Take 2 tablets by mouth daily as needed. For constipation    spironolactone (ALDACTONE) 25 MG tablet Take 25 mg by mouth daily.      Tamsulosin HCl (FLOMAX) 0.4 MG CAPS Take 0.4 mg by mouth at bedtime.     zinc sulfate 220 MG capsule Take 220 mg by mouth every evening.      !! -  Potential duplicate medications found. Please discuss with provider.      Discharged Condition:Hemodynamically stable alert oriented x3 with chronic left hemiparesis with contractures    Consults: Mark skains from cardiology . Dr.sethi from neurology.   Significant Diagnostic Studies: Dg Chest 1 View  10/19/2011  *RADIOLOGY REPORT*  Clinical Data: Weakness.  CHEST - 1 VIEW  Comparison: Chest x-ray 09/22/2011.  Findings: The pacer wires are stable.  Mild cardiac enlargement. No acute pulmonary findings.  Left basilar scarring.  IMPRESSION:  1.  Stable cardiac enlargement. 2.  No acute pulmonary findings.  Original Report Authenticated By: P. Loralie Champagne, M.D.   Dg Hip Complete Left  10/19/2011  *RADIOLOGY REPORT*  Clinical Data: Left hip pain.  LEFT HIP - COMPLETE 2+  VIEW  Comparison: None  Findings: Both hips are normally located.  There are moderate degenerative changes.  The pubic symphysis and SI joints are intact.  No definite acute pelvic fractures.  IMPRESSION: Degenerative changes but no obvious acute fracture.  Original Report Authenticated By: P. Loralie Champagne, M.D.   Ct Head Wo Contrast  10/25/2011  *RADIOLOGY REPORT*  Clinical Data: Dysphagia.  Code stroke.  Left-sided facial droop.  CT HEAD WITHOUT CONTRAST  Technique:  Contiguous axial images were obtained from the base of the skull through the vertex without contrast.  Comparison: 10/19/2011  Findings: The brainstem is unremarkable.  There is an old cerebellar infarction on the right.  The cerebral hemispheres show atrophy with extensive chronic small vessel change affecting the deep white matter.  No sign of acute infarction, mass lesion, hemorrhage, hydrocephalus or extra-axial collection.  There is atherosclerotic calcification of the major vessels at the base of the brain.  IMPRESSION: No acute or reversible findings.  Atrophy and chronic small vessel disease.  No appreciable change since 6 days ago.  Critical Value/emergent results were called by telephone at the time of interpretation on 10/25/2011  at 1850 hours  to   [facility] EDP, who verbally acknowledged these results.  Original Report Authenticated By: Thomasenia Sales, M.D.   Ct Head Wo Contrast  10/19/2011  *RADIOLOGY REPORT*  Clinical Data: Left facial droop.  Renal failure and diabetes and hypertension.  CT HEAD WITHOUT CONTRAST  Technique:  Contiguous axial images were obtained from the base of the skull through the vertex without contrast.  Comparison: None.  Findings: Generalized atrophy.  Hypodensity in the periventricular white matter, typical for chronic microvascular ischemia.  No acute infarct.  Negative for hemorrhage or mass lesion.  Atherosclerotic disease in the carotid and vertebral arteries. Calvarium is intact.  IMPRESSION:  Atrophy and chronic microvascular ischemia.  No acute abnormality.  Original Report Authenticated By: Camelia Phenes, M.D.   Ct Lumbar Spine Wo Contrast  10/20/2011  *RADIOLOGY REPORT*  Clinical Data: 77 year old male with radiculopathy, possible lumbar stenosis.  CT LUMBAR SPINE WITHOUT CONTRAST  Technique:  Multidetector CT imaging of the lumbar spine was performed without intravenous contrast administration. Multiplanar CT image reconstructions were also generated.  Comparison: CT abdomen and pelvis 09/20/2011.  Findings: Cardiac pacemaker leads partially visualized.  Stable visualized abdominal viscera including extensive calcified atherosclerosis, exophytic left renal cyst, and there has been some increase in the volume of retained stool in the colon.  Prior surgery to the L3-L4 and L4-L5 levels, see details below. Bone mineralization is within normal limits for age.  Degenerative ankylosis of the left SI joint.  Visualized sacrum is intact.  T11-T12:  Right far lateral disc osteophyte complex.  Mild ligament flavum hypertrophy.  No spinal or foraminal stenosis.  T12-L1:  Mild calcified circumferential disc bulge.  No spinal or foraminal stenosis.  L1-L2:  Mild disc bulge.  Moderate facet hypertrophy greater on the right.  Bilateral vacuum facet phenomena.  No spinal stenosis. Mild right L1 foraminal stenosis.  L2-L3:  Mild to moderate circumferential disc bulge.  Moderate facet hypertrophy.  Vacuum facet phenomenon greater on the left. No spinal stenosis.  Mild bilateral L2 foraminal stenosis.  L3-L4:  Sequelae of decompression.  Severe residual facet hypertrophy. Vacuum facet hypertrophy on the left.  Circumferential disc bulge.  Ligament flavum hypertrophy greater on the right.  No spinal stenosis, but a degree of bilateral lateral recess stenosis is suspected.  There is also severe left and moderate right bilateral L3 foraminal stenosis.  L4-L5:  Sequelae of decompression.  Anterolisthesis measuring 4-5  mm.  Very severe residual facet hypertrophy.  Circumferential disc osteophyte complex.  No spinal stenosis, but there could be a bilateral lateral recess stenosis.  There is severe bilateral L4 foraminal stenosis, worse on the left.  L5-S1:  Circumferential disc bulge.  Mild facet hypertrophy.  No spinal stenosis.  Moderate left and severe right L5 foraminal stenosis.  IMPRESSION: 1.  Previous decompression at L3-L4 and L4-L5.  - At L4-L5 there is grade 1 spondylolisthesis with very severe residual facet hypertrophy and multifactorial severe bilateral L4 foraminal stenosis and possible lateral recess stenosis. - At L3-L4 there is residual facet is and degeneration with moderate to severe bilateral L3 foraminal stenosis and possible lateral recess stenosis. 2. Moderate to severe multifactorial bilateral L5 foraminal stenosis. 3.  No lumbar spinal stenosis.  Comparatively mild for age disc and facet degeneration elsewhere in the lumbar spine.  Original Report Authenticated By: Harley Hallmark, M.D.   Dg Chest Portable 1 View  10/25/2011  *RADIOLOGY REPORT*  Clinical Data: Shortness of breath, weakness and confusion.  PORTABLE CHEST - 1 VIEW  Comparison: Chest x-ray 10/19/2011.  Findings: The heart is enlarged but stable.  The pacer wires are stable.  There is central vascular congestion without overt to pulmonary edema.  No pleural effusions or focal infiltrates.  Bony thorax is intact.  IMPRESSION: Cardiac enlargement and vascular congestion without overt pulmonary edema.  Original Report Authenticated By: P. Loralie Champagne, M.D.    Lab Results: Results for orders placed during the hospital encounter of 10/25/11 (from the past 48 hour(s))  GLUCOSE, CAPILLARY     Status: Abnormal   Collection Time   10/28/11 11:50 AM      Component Value Range Comment   Glucose-Capillary 191 (*) 70 - 99 (mg/dL)    Comment 1 Notify RN     GLUCOSE, CAPILLARY     Status: Abnormal   Collection Time   10/28/11  5:03 PM       Component Value Range Comment   Glucose-Capillary 161 (*) 70 - 99 (mg/dL)   GLUCOSE, CAPILLARY     Status: Abnormal   Collection Time   10/28/11  9:51 PM      Component Value Range Comment   Glucose-Capillary 166 (*) 70 - 99 (mg/dL)   POTASSIUM     Status: Normal   Collection Time   10/29/11  6:39 AM      Component Value Range Comment   Potassium 3.8  3.5 - 5.1 (mEq/L)   MAGNESIUM     Status: Normal   Collection Time   10/29/11  6:39 AM      Component  Value Range Comment   Magnesium 2.0  1.5 - 2.5 (mg/dL)   GLUCOSE, CAPILLARY     Status: Abnormal   Collection Time   10/29/11  8:47 AM      Component Value Range Comment   Glucose-Capillary 120 (*) 70 - 99 (mg/dL)   GLUCOSE, CAPILLARY     Status: Abnormal   Collection Time   10/29/11 11:40 AM      Component Value Range Comment   Glucose-Capillary 129 (*) 70 - 99 (mg/dL)   GLUCOSE, CAPILLARY     Status: Abnormal   Collection Time   10/29/11  4:25 PM      Component Value Range Comment   Glucose-Capillary 147 (*) 70 - 99 (mg/dL)   GLUCOSE, CAPILLARY     Status: Abnormal   Collection Time   10/29/11  9:14 PM      Component Value Range Comment   Glucose-Capillary 164 (*) 70 - 99 (mg/dL)   CBC     Status: Abnormal   Collection Time   10/30/11  5:00 AM      Component Value Range Comment   WBC 10.2  4.0 - 10.5 (K/uL)    RBC 3.12 (*) 4.22 - 5.81 (MIL/uL)    Hemoglobin 8.9 (*) 13.0 - 17.0 (g/dL)    HCT 45.4 (*) 09.8 - 52.0 (%)    MCV 89.1  78.0 - 100.0 (fL)    MCH 28.5  26.0 - 34.0 (pg)    MCHC 32.0  30.0 - 36.0 (g/dL)    RDW 11.9  14.7 - 82.9 (%)    Platelets 387  150 - 400 (K/uL)   BASIC METABOLIC PANEL     Status: Abnormal   Collection Time   10/30/11  5:00 AM      Component Value Range Comment   Sodium 140  135 - 145 (mEq/L)    Potassium 3.8  3.5 - 5.1 (mEq/L)    Chloride 106  96 - 112 (mEq/L)    CO2 28  19 - 32 (mEq/L)    Glucose, Bld 145 (*) 70 - 99 (mg/dL)    BUN 20  6 - 23 (mg/dL)    Creatinine, Ser 5.62  0.50 - 1.35  (mg/dL)    Calcium 8.9  8.4 - 10.5 (mg/dL)    GFR calc non Af Amer 85 (*) >90 (mL/min)    GFR calc Af Amer >90  >90 (mL/min)   GLUCOSE, CAPILLARY     Status: Abnormal   Collection Time   10/30/11  7:47 AM      Component Value Range Comment   Glucose-Capillary 134 (*) 70 - 99 (mg/dL)    Recent Results (from the past 240 hour(s))  URINE CULTURE     Status: Normal   Collection Time   10/25/11  8:46 PM      Component Value Range Status Comment   Specimen Description URINE, CLEAN CATCH   Final    Special Requests NONE   Final    Setup Time [id number]   Final    Colony Count >=100,000 COLONIES/ML   Final    Culture ESCHERICHIA COLI   Final    Report Status 10/27/2011 FINAL   Final    Organism ID, Bacteria ESCHERICHIA COLI   Final   MRSA PCR SCREENING     Status: Normal   Collection Time   10/25/11 11:37 PM      Component Value Range Status Comment   MRSA by PCR NEGATIVE  NEGATIVE  Final  Hospital Course:  1. toxic metabolic encephalopathy  - patient was initially classified as having TIA . After careful examination by neurology became apparent that he was not having any stroke or transient ischemic attack . He was mildly confused as a combination of hypoglycemia and urinary tract infection  The confusion resolved once the hypoglycemia resolved and we started appropriate antibiotics for UTI . 2. urinary tract infection - the culture grew E coli resistant to fluoroquinolones. The patient was treated with Ceftin and he has 5 more days of this antibiotic. 3. old stroke with chronic left hemiparesis and contractures - patient requires skilled nursing home for therapy. I have added baclofen for muscle contractures.    Discharge Exam: Blood pressure 148/61, pulse 59, temperature 98.8 F (37.1 C), temperature source Oral, resp. rate 16, height 5\' 10"  (1.778 m), weight 72.9 kg (160 lb 11.5 oz), SpO2 98.00%. Alert oriented x3 . Stable chronic left hemiparesis . Lungs clear . Heart regular     Disposition: To nursing home . Time spent in discharging the patient 41 min .  Discharge Orders    Future Orders Please Complete By Expires   Diet - low sodium heart healthy      Increase activity slowly         Follow-up Information    Follow up with ROBSON,MICHAEL GAVIN.   Contact information:   [address] [place] [address] [phone]          Signed: Lonia Blood 10/30/2011, 11:35 AM

## 2011-10-30 NOTE — Progress Notes (Signed)
Subjective: Interval History: Pt had been eval in office with left heel ulcer and was scheduled for arteriogram today. He was admitted 10/25/11 with a TIA. I am unable to obtain any meaningful history from he patient.   ROS: unable to obtain  Objective: Vital signs in last 24 hours: Temp:  [98.6 F (37 C)-99 F (37.2 C)] 98.8 F (37.1 C) (11/05 0500) Pulse Rate:  [52-64] 52  (11/05 0500) Resp:  [12-18] 16  (11/05 0500) BP: (167-196)/(54-76) 196/64 mmHg (11/05 0500) SpO2:  [96 %-98 %] 98 % (11/05 0500) Weight:  [160 lb 11.5 oz (72.9 kg)] 160 lb 11.5 oz (72.9 kg) (11/05 0500)  Intake/Output from previous day: 11/04 0701 - 11/05 0700 In: 960 [P.O.:960] Out: 400 [Urine:400] Intake/Output this shift: Total I/O In: 240 [P.O.:240] Out: 400 [Urine:400]  General appearance: cachectic and slowed mentation Extremities: no edema, redness or tenderness in the calves or thighs Incision/Wound: Dry gangrene of left heel without change  Lab Results: No results found for this basename: WBC:2,HGB:2,HCT:2,PLT:2 in the last 72 hours BMET  [facility] 10/29/11 0639  NA --  K 3.8  CL --  CO2 --  GLUCOSE --  BUN --  CREATININE --  CALCIUM --    Studies/Results: Dg Chest 1 View  10/19/2011  *RADIOLOGY REPORT*  Clinical Data: Weakness.  CHEST - 1 VIEW  Comparison: Chest x-ray 09/22/2011.  Findings: The pacer wires are stable.  Mild cardiac enlargement. No acute pulmonary findings.  Left basilar scarring.  IMPRESSION:  1.  Stable cardiac enlargement. 2.  No acute pulmonary findings.  Original Report Authenticated By: P. Loralie Champagne, M.D.   Dg Hip Complete Left  10/19/2011  *RADIOLOGY REPORT*  Clinical Data: Left hip pain.  LEFT HIP - COMPLETE 2+ VIEW  Comparison: None  Findings: Both hips are normally located.  There are moderate degenerative changes.  The pubic symphysis and SI joints are intact.  No definite acute pelvic fractures.  IMPRESSION: Degenerative changes but no obvious acute  fracture.  Original Report Authenticated By: P. Loralie Champagne, M.D.   Ct Head Wo Contrast  10/25/2011  *RADIOLOGY REPORT*  Clinical Data: Dysphagia.  Code stroke.  Left-sided facial droop.  CT HEAD WITHOUT CONTRAST  Technique:  Contiguous axial images were obtained from the base of the skull through the vertex without contrast.  Comparison: 10/19/2011  Findings: The brainstem is unremarkable.  There is an old cerebellar infarction on the right.  The cerebral hemispheres show atrophy with extensive chronic small vessel change affecting the deep white matter.  No sign of acute infarction, mass lesion, hemorrhage, hydrocephalus or extra-axial collection.  There is atherosclerotic calcification of the major vessels at the base of the brain.  IMPRESSION: No acute or reversible findings.  Atrophy and chronic small vessel disease.  No appreciable change since 6 days ago.  Critical Value/emergent results were called by telephone at the time of interpretation on 10/25/2011  at 1850 hours  to   [facility] EDP, who verbally acknowledged these results.  Original Report Authenticated By: Thomasenia Sales, M.D.   Ct Head Wo Contrast  10/19/2011  *RADIOLOGY REPORT*  Clinical Data: Left facial droop.  Renal failure and diabetes and hypertension.  CT HEAD WITHOUT CONTRAST  Technique:  Contiguous axial images were obtained from the base of the skull through the vertex without contrast.  Comparison: None.  Findings: Generalized atrophy.  Hypodensity in the periventricular white matter, typical for chronic microvascular ischemia.  No acute infarct.  Negative for hemorrhage or  mass lesion.  Atherosclerotic disease in the carotid and vertebral arteries. Calvarium is intact.  IMPRESSION: Atrophy and chronic microvascular ischemia.  No acute abnormality.  Original Report Authenticated By: Camelia Phenes, M.D.   Ct Lumbar Spine Wo Contrast  10/20/2011  *RADIOLOGY REPORT*  Clinical Data: 77 year old male with radiculopathy,  possible lumbar stenosis.  CT LUMBAR SPINE WITHOUT CONTRAST  Technique:  Multidetector CT imaging of the lumbar spine was performed without intravenous contrast administration. Multiplanar CT image reconstructions were also generated.  Comparison: CT abdomen and pelvis 09/20/2011.  Findings: Cardiac pacemaker leads partially visualized.  Stable visualized abdominal viscera including extensive calcified atherosclerosis, exophytic left renal cyst, and there has been some increase in the volume of retained stool in the colon.  Prior surgery to the L3-L4 and L4-L5 levels, see details below. Bone mineralization is within normal limits for age.  Degenerative ankylosis of the left SI joint.  Visualized sacrum is intact.  T11-T12:  Right far lateral disc osteophyte complex.  Mild ligament flavum hypertrophy.  No spinal or foraminal stenosis.  T12-L1:  Mild calcified circumferential disc bulge.  No spinal or foraminal stenosis.  L1-L2:  Mild disc bulge.  Moderate facet hypertrophy greater on the right.  Bilateral vacuum facet phenomena.  No spinal stenosis. Mild right L1 foraminal stenosis.  L2-L3:  Mild to moderate circumferential disc bulge.  Moderate facet hypertrophy.  Vacuum facet phenomenon greater on the left. No spinal stenosis.  Mild bilateral L2 foraminal stenosis.  L3-L4:  Sequelae of decompression.  Severe residual facet hypertrophy. Vacuum facet hypertrophy on the left.  Circumferential disc bulge.  Ligament flavum hypertrophy greater on the right.  No spinal stenosis, but a degree of bilateral lateral recess stenosis is suspected.  There is also severe left and moderate right bilateral L3 foraminal stenosis.  L4-L5:  Sequelae of decompression.  Anterolisthesis measuring 4-5 mm.  Very severe residual facet hypertrophy.  Circumferential disc osteophyte complex.  No spinal stenosis, but there could be a bilateral lateral recess stenosis.  There is severe bilateral L4 foraminal stenosis, worse on the left.  L5-S1:   Circumferential disc bulge.  Mild facet hypertrophy.  No spinal stenosis.  Moderate left and severe right L5 foraminal stenosis.  IMPRESSION: 1.  Previous decompression at L3-L4 and L4-L5.  - At L4-L5 there is grade 1 spondylolisthesis with very severe residual facet hypertrophy and multifactorial severe bilateral L4 foraminal stenosis and possible lateral recess stenosis. - At L3-L4 there is residual facet is and degeneration with moderate to severe bilateral L3 foraminal stenosis and possible lateral recess stenosis. 2. Moderate to severe multifactorial bilateral L5 foraminal stenosis. 3.  No lumbar spinal stenosis.  Comparatively mild for age disc and facet degeneration elsewhere in the lumbar spine.  Original Report Authenticated By: Harley Hallmark, M.D.   Dg Chest Portable 1 View  10/25/2011  *RADIOLOGY REPORT*  Clinical Data: Shortness of breath, weakness and confusion.  PORTABLE CHEST - 1 VIEW  Comparison: Chest x-ray 10/19/2011.  Findings: The heart is enlarged but stable.  The pacer wires are stable.  There is central vascular congestion without overt to pulmonary edema.  No pleural effusions or focal infiltrates.  Bony thorax is intact.  IMPRESSION: Cardiac enlargement and vascular congestion without overt pulmonary edema.  Original Report Authenticated By: P. Loralie Champagne, M.D.   Anti-infectives: Anti-infectives    None      Assessment/Plan: This patient was scheduled for an arteriogram today. Given his recent stroke, question of MI, and markedly debilitated state,  this will be cancelled today. Currently the heel decubitus is stable and not contributing significantly to his illness.  I will follow this as an outpatient once he is discharged.     LOS: 5 days   DICKSON,CHRISTOPHER S 10/30/2011, 6:57 AM

## 2011-10-30 NOTE — Plan of Care (Signed)
Problem: Phase II Progression Outcomes Goal: Other Phase II Outcomes/Goals Variance: Communication barriers Comments: Pt making progress with comm/cog goals as noted in SLP eval/treatments.

## 2011-10-30 NOTE — Progress Notes (Signed)
D/c orders received; iv removed with gauze on, pt remains in stable condition, pt meds and instructions reviewed and given to pt;  Pt transferred to SNF via ambulance

## 2011-10-30 NOTE — Progress Notes (Addendum)
Speech Language/Pathology  Pt seen for comm/cog therapy.  Pt reported significant pain (10/10) in his left leg.  RN was called to come to the room.  Pt was repositioned in bed.  Pillow place between knees.    GOAL: PT WILL INCREASE TEMPORAL ORIENTATION WITH MOD ASSIST TO USE ENVIRONMENTAL CUES Pt oriented to person, place and time without using environmental cues.  GOAL: PT WILL IMPROVE SPEECH INTELLIGIBILITY THROUGH USE OF 2 COMPENSATORY STRATEGIES WITH MOD ASSIST Speech intelligible.  Pt unable to verbalize strategies to increase clarity of speech.   GOAL: PT WILL LIST 2-3 ACTIVITIES OF HIS DAY TO IMPROVE WORKING MEMORY WITH MODERATE ASSIST Pt verbalized that he had not done anything today aside from being in bed. He was unable to indicate additional activities.  GOAL: PT WILL DEMONSTRATE IMPROVED PROBLEM SOLVING ABILITIES VIA HYPOTHETICAL MEDICAL SITUATIONS WITH MAX ASSIST Pt indicated that to call the RN he would call on the telephone. Pt educated re: call bell, which was then attached close to him. Pt unable to provide solution for getting out of bed or going to the bathroom.

## 2011-11-01 NOTE — Consult Note (Signed)
[name], [name]:  [id number]  MEDICAL RECORD NO.:  [id number]  LOCATION:  3741                         FACILITY:  [facility]  PHYSICIAN:  Jake Bathe, MD      DATE OF BIRTH:  1927/09/23  DATE OF CONSULTATION:  10/26/2011 DATE OF DISCHARGE:                                CONSULTATION   REQUESTING PHYSICIAN:  Vania Rea, MD of [facility].  REASON FOR CONSULTATION:  To evaluate chronic systolic heart failure and elevated MB with normal troponin.  HISTORY OF PRESENT ILLNESS:  [name] is a very pleasant 77 year old male who has had several recent hospitalizations, both due to recent urinary cystitis and TIA who was just discharged on October 23, 2011 who is here once again with right facial droop and slurring of speech.  We were consulted to evaluate his heart failure and his elevated CK-MB of 9 with normal troponin.  When interviewing him this morning, he states that he is feeling well.  No shortness of breath.  No chest pain.  He is up right in bed at a 50-degree angle eating his breakfast.  He recognized me, and he was very pleasant.  He denies any recent shortness of breath.  PAST MEDICAL HISTORY: 1. Cardiomyopathy/systolic heart failure with ejection fraction at     last echocardiogram on October 20, 2011 demonstrating an EF of 40%-     45%. 2. Status post pacemaker placement. 3. Recent transient ischemic attack with left-sided hemiparesis. 4. Diabetes. 5. Hypertension, difficult to control.  Partially responsible for his     cardiomyopathy. 6. Dyslipidemia. 7. History of complete heart block - pacemaker. 8. Peripheral vascular disease, not a candidate for revascularization     surgery.  ABI on the right 0.46, ABI on the left 0.58. 9. Chronic left heel decubitus. 10.History of spinal stenosis.  ALLERGIES:  No known drug allergies.  MEDICATIONS:  From [facility] demonstrate: 1. Lantus 15 units at  bedtime. 2. Remeron 7.5 mg at night. 3. Skelaxin 800 mg t.i.d. p.r.n. 4. Senna p.r.n. constipation. 5. Tamsulosin 0.4 mg p.o. daily. 6. Zinc, Percocet p.r.n. 7. ICaps once a day. 8. Boost 3 times a day with meals. 9. He had been on Levaquin 500 mg for 7-day treatment. 10.Aldactone 25 mg once a day (potassium on admission 5.2, creatinine     1.1). 11.Aspirin 81 mg a day. 12.Coreg 12.5 mg twice a day. 13.Finasteride 5 mg once a day. 14.Lipitor 20 mg once a day. 15.Lisinopril 5 mg once a day. 16.Plavix 75 mg once a day. 17.Pro-Stat 30 mg b.i.d.  SOCIAL HISTORY:  Currently at [facility].  Nonsmoker, non drinker.  No drug use.  FAMILY HISTORY:  Currently noncontributory.  REVIEW OF SYSTEMS:  Unless specified above, all other 12 review of systems negative.  No recent bleeding.  No subjective fevers.  PHYSICAL EXAMINATION:  VITAL SIGNS:  Temperature 98.5, pulse 75, respirations 16, blood pressure 94/36-124/50, satting 100% on room air. GENERAL:  Alert and oriented x3.  No acute distress.  He has visually taken diminished in his overall healthy appearance over the past year. EYES:  Pale conjunctivae.  EOMI.  No scleral  icterus. NECK:  Supple.  No lymphadenopathy.  No thyromegaly.  No carotid bruits. No JVD. CARDIOVASCULAR:  Regular rate and rhythm.  Soft systolic murmur heard mostly at the right upper sternal border. LUNGS:  Clear to auscultation bilaterally.  Normal respiratory effort. ABDOMEN:  Soft, nontender, and normoactive bowel sounds.  No rebound or guarding. EXTREMITIES:  No clubbing, cyanosis, or edema noted.  He does have a left heel decubitus, which is currently dressed.  Difficult to palpate distal pulses. NEUROLOGIC:  Improved speech, appears to be at baseline from my prior recollection.  Neuro exam reviewed by neurology report.  DATA:  Chest x-ray showed mild pulmonary vascular congestion without any overt edema.  CT of head showed no acute  reversible findings.  No change from 6 days ago.  Echocardiogram showed EF of 45%-50% on October 20, 2011.  On September 21, 2011, ejection fraction was thought to be 20%- 25% and on June 03, 2010, ejection fraction was thought to be 35%-40%. Mitral annular calcification noted.  Prior medical records reviewed.  An EKG shows a paced rhythm with occasional PVC, ventricular rate in 80.  ASSESSMENT AND PLAN:  An 77 year old male with recent transient ischemic attack, chronic systolic heart failure, pacemaker placement due to complete heart block, hypertension with elevated CK-MB, normal troponin. 1. Chronic systolic heart failure - currently compensated.  No     clinical history of worsening shortness of breath.  He is quite     debilitated currently, but he states that he is comfortable without     any shortness of breath.  Blood pressures are very well controlled     on current regimen.  Please be cognizant of his potassium of 5.2     with the spironolactone.  Continue with current medication     strategy.  I do believe that he is well compensated currently.     This includes beta-blocker and low-dose lisinopril and Aldactone.     If blood pressures get too low, one could consider discontinuation     of Aldactone. 2. Hypertension - excellently controlled currently.  Monitor. 3. Elevated CK-MB with normal troponin - this is from a noncardiac     source.  Some other source of muscle breakdown noted.  This was     also present on prior hospitalization, perhaps from left heel     decubitus and associated immobility perhaps. 4. Leukocytosis - per primary team.  Recent urinary infection. 5. Diabetes per primary team. 6. Transient ischemic attack per Neurology - since he had his recent     echocardiogram, I have canceled ordered echocardiogram.  From a     cardiac perspective, I am fine with him discontinuing his IV     heparin.  If Neurology wishes to keep this on over the day, that is      fine. Please let us know if we can be of any further assistance.  Thank you very much.     Jake Bathe, MD     MCS/MEDQ  D:  10/26/2011  T:  10/26/2011  Job:  [id number]  Electronically Signed by Donato Schultz MD on 11/01/2011 06:51:04 AM

## 2011-11-02 NOTE — Consult Note (Signed)
NAMEELENA, COTHERN NO.:  [id number]  MEDICAL RECORD NO.:  [id number]  LOCATION:  3741                         FACILITY:  [facility]  PHYSICIAN:  Carmell Austria, MD        DATE OF BIRTH:  06-08-1927  DATE OF CONSULTATION:  DATE OF DISCHARGE:                                CONSULTATION   CHIEF COMPLAINT:  Right-sided facial droop and slurred speech.  HISTORY OF PRESENT ILLNESS:  This is an 77 year old man with past medical history of pacer CHF, hypertension, diabetes, hyperlipidemia, who presents with fluctuating right facial droop and slurred speech since 3 o'clock today.  I was called to the bedside at around 07:45 p.m. At the time, I saw the patient, the patient appeared to be non-focal except for some slurred speech.  CT head done in the emergency room was unremarkable.  The patient's background is that he was admitted on October 25 for also right-sided weakness that has resolved and was diagnosed with TIA.  Due to pacer, the patient was not able to get an MRI.  Today, in the nursing home, it appears that the patient's systolic blood pressure was around 80 and that is when the majority of his symptoms occurred.  Currently, blood pressure is 150/80.  PAST MEDICAL HISTORY:  UTI, also includes TIA, spinal stenosis, chronic left heel decubitus ulcer, left hip pain, which is subacute and peripheral vascular disease.  MEDICATIONS:  NovoLog insulin, Levaquin, Lotrimin, Lantus, Aldactone, aspirin boost, carvedilol, Santyl, finasteride, Lipitor, lisinopril, Percocet, Plavix, Pro-Stat, Remeron, Skelaxin, Senokot, tamsulosin, zinc oxide.  ALLERGIES:  No known drug allergies.  SOCIAL HISTORY:  Lives in a nursing home.  No toxic habits.  FAMILY HISTORY:  Noncontributory.  REVIEW OF SYSTEMS:  Significant for left hip pain for which he had an x- ray recently which did not show any fracture.  PHYSICAL EXAMINATION:  VITAL SIGNS:  Temperature 97.8, blood  pressure 132/60, heart rate 72, respirations 18. GENERAL:  He is awake, alert, oriented to self, knows he is in the hospital but not the name of the hospital.  Cannot tell me the date.  He has no expressive or receptive aphasia.  He was able to follow some complex commands intermittently, was able to follow although simple commands without trouble. HEENT:  No facial droop.  Extraocular movements are intact.  Pupils are equal, round, and reactive to light and accommodation.  No nystagmus. Visual fields are full.  He was mildly dysarthric. MOTOR:  He had no drift.  He had no weakness in his upper extremities and right lower extremity.  Left lower extremity appears to be somewhat twisted, and he was about 2/5 in proximal muscles in that due to pain limitation distally.  He was 5/5 in the right lower extremity. COORDINATION:  Finger-to-nose was intact bilaterally. REFLEXES:  Were 1+ in upper extremities, 0 at the knees and ankles. Plantars are mute.  I could not assess his gait due to the patient's hip problem.  LABORATORY DATA:  Of significance, CBC; white count of 16.3.  He is mildly anemic with H and H of 9.2/29.4.  His potassium is 5.2.  EKG showed paced rhythm at 73.  CT head showed  no acute stroke.  ASSESSMENT AND PLAN:  This is an 77 year old man with multiple medical problems, who comes in with TIA or stroke.  He has had similar fluctuating symptoms since October 25 with right-sided facial droop, right-sided weakness.  Today's symptoms occurred in the setting of hypotension.  Currently, he has slurred speech, however, it is uncertain if this is due to stroke because he normally wears dentures.  Otherwise, he does not appear to be focal.  I recommend stroke admission for observation and testing.  I recommend that the orders from the stroke order sheet be followed except for MRI which he cannot have due to pacer as well as echo and carotids which he has had recently.  The  decision regarding telemetry, I will leave up to the primary team.  Otherwise, please follow the orders in order sheets which I included with the patient's chart.  His elevated white count should be addressed and perhaps an urinary tract infection should be considered and worked up with UA and urine culture.  The stroke team will follow.          ______________________________ Carmell Austria, MD     DB/MEDQ  D:  10/25/2011  T:  10/26/2011  Job:  [id number]  Electronically Signed by Carmell Austria MD on 11/02/2011 07:20:41 AM

## 2011-11-03 ENCOUNTER — Encounter (HOSPITAL_COMMUNITY): Payer: Self-pay | Admitting: *Deleted

## 2011-11-03 ENCOUNTER — Emergency Department (HOSPITAL_COMMUNITY): Payer: Medicare Other

## 2011-11-03 ENCOUNTER — Inpatient Hospital Stay (HOSPITAL_COMMUNITY)
Admission: EM | Admit: 2011-11-03 | Discharge: 2011-11-14 | DRG: 682 | Disposition: A | Discharge status: Skilled Nursing Facility | Payer: Medicare Other | Attending: Internal Medicine | Admitting: Internal Medicine

## 2011-11-03 ENCOUNTER — Other Ambulatory Visit: Payer: Self-pay

## 2011-11-03 DIAGNOSIS — T400X1A Poisoning by opium, accidental (unintentional), initial encounter: Secondary | ICD-10-CM | POA: Diagnosis present

## 2011-11-03 DIAGNOSIS — I739 Peripheral vascular disease, unspecified: Secondary | ICD-10-CM | POA: Diagnosis present

## 2011-11-03 DIAGNOSIS — D638 Anemia in other chronic diseases classified elsewhere: Secondary | ICD-10-CM | POA: Diagnosis present

## 2011-11-03 DIAGNOSIS — E119 Type 2 diabetes mellitus without complications: Secondary | ICD-10-CM | POA: Diagnosis present

## 2011-11-03 DIAGNOSIS — G459 Transient cerebral ischemic attack, unspecified: Secondary | ICD-10-CM

## 2011-11-03 DIAGNOSIS — I2510 Atherosclerotic heart disease of native coronary artery without angina pectoris: Secondary | ICD-10-CM | POA: Diagnosis present

## 2011-11-03 DIAGNOSIS — I7025 Atherosclerosis of native arteries of other extremities with ulceration: Secondary | ICD-10-CM | POA: Diagnosis present

## 2011-11-03 DIAGNOSIS — M25559 Pain in unspecified hip: Secondary | ICD-10-CM | POA: Diagnosis present

## 2011-11-03 DIAGNOSIS — L8995 Pressure ulcer of unspecified site, unstageable: Secondary | ICD-10-CM | POA: Diagnosis present

## 2011-11-03 DIAGNOSIS — Z7902 Long term (current) use of antithrombotics/antiplatelets: Secondary | ICD-10-CM

## 2011-11-03 DIAGNOSIS — R29810 Facial weakness: Secondary | ICD-10-CM | POA: Diagnosis present

## 2011-11-03 DIAGNOSIS — Z950 Presence of cardiac pacemaker: Secondary | ICD-10-CM | POA: Diagnosis present

## 2011-11-03 DIAGNOSIS — G8929 Other chronic pain: Secondary | ICD-10-CM | POA: Diagnosis present

## 2011-11-03 DIAGNOSIS — R531 Weakness: Secondary | ICD-10-CM | POA: Diagnosis present

## 2011-11-03 DIAGNOSIS — G929 Unspecified toxic encephalopathy: Secondary | ICD-10-CM | POA: Diagnosis present

## 2011-11-03 DIAGNOSIS — I442 Atrioventricular block, complete: Secondary | ICD-10-CM | POA: Diagnosis present

## 2011-11-03 DIAGNOSIS — I5022 Chronic systolic (congestive) heart failure: Secondary | ICD-10-CM | POA: Diagnosis present

## 2011-11-03 DIAGNOSIS — E1122 Type 2 diabetes mellitus with diabetic chronic kidney disease: Secondary | ICD-10-CM | POA: Diagnosis present

## 2011-11-03 DIAGNOSIS — I509 Heart failure, unspecified: Secondary | ICD-10-CM | POA: Diagnosis present

## 2011-11-03 DIAGNOSIS — I69959 Hemiplegia and hemiparesis following unspecified cerebrovascular disease affecting unspecified side: Secondary | ICD-10-CM

## 2011-11-03 DIAGNOSIS — R1310 Dysphagia, unspecified: Secondary | ICD-10-CM | POA: Diagnosis not present

## 2011-11-03 DIAGNOSIS — Z531 Procedure and treatment not carried out because of patient's decision for reasons of belief and group pressure: Secondary | ICD-10-CM

## 2011-11-03 DIAGNOSIS — G92 Toxic encephalopathy: Secondary | ICD-10-CM | POA: Diagnosis present

## 2011-11-03 DIAGNOSIS — T40601A Poisoning by unspecified narcotics, accidental (unintentional), initial encounter: Secondary | ICD-10-CM | POA: Diagnosis present

## 2011-11-03 DIAGNOSIS — R410 Disorientation, unspecified: Secondary | ICD-10-CM

## 2011-11-03 DIAGNOSIS — J189 Pneumonia, unspecified organism: Secondary | ICD-10-CM | POA: Diagnosis not present

## 2011-11-03 DIAGNOSIS — L89609 Pressure ulcer of unspecified heel, unspecified stage: Secondary | ICD-10-CM | POA: Diagnosis present

## 2011-11-03 DIAGNOSIS — N183 Chronic kidney disease, stage 3 unspecified: Secondary | ICD-10-CM | POA: Diagnosis present

## 2011-11-03 DIAGNOSIS — N179 Acute kidney failure, unspecified: Principal | ICD-10-CM | POA: Diagnosis present

## 2011-11-03 DIAGNOSIS — R4182 Altered mental status, unspecified: Secondary | ICD-10-CM | POA: Diagnosis present

## 2011-11-03 DIAGNOSIS — I69359 Hemiplegia and hemiparesis following cerebral infarction affecting unspecified side: Secondary | ICD-10-CM

## 2011-11-03 DIAGNOSIS — D72829 Elevated white blood cell count, unspecified: Secondary | ICD-10-CM | POA: Diagnosis present

## 2011-11-03 HISTORY — DX: Dysphagia following cerebral infarction: I69.391

## 2011-11-03 LAB — COMPREHENSIVE METABOLIC PANEL
ALT: 41 U/L (ref 0–53)
AST: 45 U/L — ABNORMAL HIGH (ref 0–37)
Albumin: 2.1 g/dL — ABNORMAL LOW (ref 3.5–5.2)
Alkaline Phosphatase: 136 U/L — ABNORMAL HIGH (ref 39–117)
BUN: 18 mg/dL (ref 6–23)
CO2: 28 mEq/L (ref 19–32)
Calcium: 8.9 mg/dL (ref 8.4–10.5)
Chloride: 101 mEq/L (ref 96–112)
Creatinine, Ser: 0.96 mg/dL (ref 0.50–1.35)
GFR calc Af Amer: 87 mL/min — ABNORMAL LOW (ref >90)
GFR calc non Af Amer: 75 mL/min — ABNORMAL LOW (ref >90)
Glucose, Bld: 133 mg/dL — ABNORMAL HIGH (ref 70–99)
Potassium: 4.9 mEq/L (ref 3.5–5.1)
Sodium: 136 mEq/L (ref 135–145)
Total Bilirubin: 0.4 mg/dL (ref 0.3–1.2)
Total Protein: 6.6 g/dL (ref 6.0–8.3)

## 2011-11-03 LAB — POCT I-STAT, CHEM 8
Calcium, Ion: 1.15 mmol/L (ref 1.12–1.32)
Creatinine, Ser: 1.1 mg/dL (ref 0.50–1.35)
Glucose, Bld: 135 mg/dL — ABNORMAL HIGH (ref 70–99)
Hemoglobin: 9.9 g/dL — ABNORMAL LOW (ref 13.0–17.0)
Potassium: 4.8 mEq/L (ref 3.5–5.1)

## 2011-11-03 LAB — URINALYSIS, ROUTINE W REFLEX MICROSCOPIC
Bilirubin Urine: NEGATIVE
Glucose, UA: NEGATIVE mg/dL
Hgb urine dipstick: NEGATIVE
Ketones, ur: NEGATIVE mg/dL
Nitrite: NEGATIVE
Protein, ur: NEGATIVE mg/dL
Specific Gravity, Urine: 1.016 (ref 1.005–1.030)
Urobilinogen, UA: 0.2 mg/dL (ref 0.0–1.0)
pH: 6 (ref 5.0–8.0)

## 2011-11-03 LAB — CK TOTAL AND CKMB (NOT AT ARMC)
CK, MB: 11 ng/mL (ref 0.3–4.0)
Relative Index: 4.6 — ABNORMAL HIGH (ref 0.0–2.5)
Total CK: 238 U/L — ABNORMAL HIGH (ref 7–232)

## 2011-11-03 LAB — DIFFERENTIAL
Basophils Absolute: 0 10*3/uL (ref 0.0–0.1)
Basophils Relative: 0 % (ref 0–1)
Eosinophils Absolute: 0.4 10*3/uL (ref 0.0–0.7)
Eosinophils Relative: 3 % (ref 0–5)
Lymphocytes Relative: 12 % (ref 12–46)
Lymphs Abs: 1.6 10*3/uL (ref 0.7–4.0)
Monocytes Absolute: 1.1 10*3/uL — ABNORMAL HIGH (ref 0.1–1.0)
Monocytes Relative: 7 % (ref 3–12)
Neutro Abs: 11.1 10*3/uL — ABNORMAL HIGH (ref 1.7–7.7)
Neutrophils Relative %: 78 % — ABNORMAL HIGH (ref 43–77)

## 2011-11-03 LAB — CBC
HCT: 27.5 % — ABNORMAL LOW (ref 39.0–52.0)
Hemoglobin: 8.9 g/dL — ABNORMAL LOW (ref 13.0–17.0)
MCH: 28.4 pg (ref 26.0–34.0)
MCH: 28.6 pg (ref 26.0–34.0)
MCHC: 32.4 g/dL (ref 30.0–36.0)
MCHC: 32.3 g/dL (ref 30.0–36.0)
MCV: 87.9 fL (ref 78.0–100.0)
MCV: 88.4 fL (ref 78.0–100.0)
Platelets: 360 10*3/uL (ref 150–400)
Platelets: 384 10*3/uL (ref 150–400)
RBC: 3.13 MIL/uL — ABNORMAL LOW (ref 4.22–5.81)
RDW: 15 % (ref 11.5–15.5)
RDW: 14.9 % (ref 11.5–15.5)
WBC: 14.1 10*3/uL — ABNORMAL HIGH (ref 4.0–10.5)

## 2011-11-03 LAB — PROTIME-INR
INR: 1.33 (ref 0.00–1.49)
Prothrombin Time: 16.7 seconds — ABNORMAL HIGH (ref 11.6–15.2)

## 2011-11-03 LAB — TROPONIN I: Troponin I: <0.3 ng/mL (ref <0.30)

## 2011-11-03 LAB — URINE MICROSCOPIC-ADD ON

## 2011-11-03 LAB — APTT: aPTT: 36 seconds (ref 24–37)

## 2011-11-03 MED ORDER — HYDRALAZINE HCL 20 MG/ML IJ SOLN
10.0000 mg | Freq: Four times a day (QID) | INTRAMUSCULAR | Status: DC | PRN
  Administered 2011-11-03 – 2011-11-04 (×2): 10 mg via INTRAVENOUS
  Filled 2011-11-03 (×2): qty 0.5

## 2011-11-03 MED ORDER — OXYCODONE-ACETAMINOPHEN 5-325 MG PO TABS: 1.0000 | ORAL_TABLET | Freq: Four times a day (QID) | ORAL | Status: DC | PRN

## 2011-11-03 MED ORDER — DEXTROSE 5 % IV SOLN
1.0000 g | INTRAVENOUS | Status: DC
  Administered 2011-11-03: 1 g via INTRAVENOUS
  Filled 2011-11-03: qty 10

## 2011-11-03 MED ORDER — SIMVASTATIN 20 MG PO TABS
20.0000 mg | ORAL_TABLET | Freq: Every day | ORAL | Status: DC
  Filled 2011-11-03: qty 1

## 2011-11-03 MED ORDER — FINASTERIDE 5 MG PO TABS
5.0000 mg | ORAL_TABLET | Freq: Every day | ORAL | Status: DC
  Filled 2011-11-03: qty 1

## 2011-11-03 MED ORDER — INSULIN ASPART 100 UNIT/ML ~~LOC~~ SOLN: 1.0000 [IU] | Freq: Three times a day (TID) | SUBCUTANEOUS | Status: DC

## 2011-11-03 MED ORDER — CARVEDILOL 12.5 MG PO TABS
12.5000 mg | ORAL_TABLET | Freq: Two times a day (BID) | ORAL | Status: DC
  Filled 2011-11-03: qty 1

## 2011-11-03 MED ORDER — INSULIN ASPART 100 UNIT/ML ~~LOC~~ SOLN
0.0000 [IU] | SUBCUTANEOUS | Status: DC
  Administered 2011-11-04 – 2011-11-06 (×5): 2 [IU] via SUBCUTANEOUS
  Administered 2011-11-06: 3 [IU] via SUBCUTANEOUS
  Administered 2011-11-06 – 2011-11-07 (×5): 2 [IU] via SUBCUTANEOUS
  Administered 2011-11-08: 5 [IU] via SUBCUTANEOUS

## 2011-11-03 MED ORDER — LEVOFLOXACIN IN D5W 500 MG/100ML IV SOLN
500.0000 mg | INTRAVENOUS | Status: DC
  Administered 2011-11-03: 500 mg via INTRAVENOUS
  Filled 2011-11-03: qty 100

## 2011-11-03 MED ORDER — ASPIRIN 300 MG RE SUPP
300.0000 mg | Freq: Every day | RECTAL | Status: DC
  Administered 2011-11-03 – 2011-11-05 (×2): 300 mg via RECTAL
  Filled 2011-11-03 (×5): qty 1

## 2011-11-03 MED ORDER — ASPIRIN 81 MG PO CHEW: 324.0000 mg | CHEWABLE_TABLET | Freq: Every day | ORAL | Status: DC

## 2011-11-03 MED ORDER — MIRTAZAPINE 7.5 MG PO TABS
7.5000 mg | ORAL_TABLET | Freq: Every day | ORAL | Status: DC
  Filled 2011-11-03: qty 1

## 2011-11-03 MED ORDER — LEVOFLOXACIN 500 MG PO TABS: 500.0000 mg | ORAL_TABLET | Freq: Every day | ORAL | Status: DC

## 2011-11-03 MED ORDER — INSULIN ASPART 100 UNIT/ML ~~LOC~~ SOLN
0.0000 [IU] | Freq: Three times a day (TID) | SUBCUTANEOUS | Status: DC
  Filled 2011-11-03: qty 3

## 2011-11-03 MED ORDER — LISINOPRIL 5 MG PO TABS: 5.0000 mg | ORAL_TABLET | Freq: Every day | ORAL | Status: DC

## 2011-11-03 MED ORDER — BACLOFEN 5 MG HALF TABLET
5.0000 mg | ORAL_TABLET | Freq: Three times a day (TID) | ORAL | Status: DC
  Filled 2011-11-03: qty 1

## 2011-11-03 MED ORDER — BISACODYL 10 MG RE SUPP
10.0000 mg | Freq: Every day | RECTAL | Status: DC | PRN
  Administered 2011-11-06: 10 mg via RECTAL
  Filled 2011-11-03: qty 1

## 2011-11-03 MED ORDER — HEPARIN SODIUM (PORCINE) 5000 UNIT/ML IJ SOLN
5000.0000 [IU] | Freq: Three times a day (TID) | INTRAMUSCULAR | Status: DC
  Administered 2011-11-03 – 2011-11-14 (×33): 5000 [IU] via SUBCUTANEOUS
  Filled 2011-11-03 (×36): qty 1

## 2011-11-03 MED ORDER — ONDANSETRON HCL 4 MG/2ML IJ SOLN: 4.0000 mg | Freq: Four times a day (QID) | INTRAMUSCULAR | Status: DC | PRN

## 2011-11-03 MED ORDER — ONDANSETRON HCL 4 MG PO TABS: 4.0000 mg | ORAL_TABLET | Freq: Four times a day (QID) | ORAL | Status: DC | PRN

## 2011-11-03 MED ORDER — ALBUTEROL SULFATE (5 MG/ML) 0.5% IN NEBU: 2.5000 mg | INHALATION_SOLUTION | RESPIRATORY_TRACT | Status: DC | PRN

## 2011-11-03 MED ORDER — IOHEXOL 350 MG/ML SOLN
50.0000 mL | Freq: Once | INTRAVENOUS | Status: AC | PRN
  Administered 2011-11-03: 50 mL via INTRAVENOUS

## 2011-11-03 MED ORDER — CEFUROXIME AXETIL 500 MG PO TABS
500.0000 mg | ORAL_TABLET | Freq: Two times a day (BID) | ORAL | Status: DC
  Filled 2011-11-03: qty 1

## 2011-11-03 MED ORDER — DOCUSATE SODIUM 100 MG PO CAPS
100.0000 mg | ORAL_CAPSULE | Freq: Two times a day (BID) | ORAL | Status: DC
  Filled 2011-11-03: qty 1

## 2011-11-03 MED ORDER — PROSOURCE NO CARB PO LIQD
30.0000 mL | Freq: Two times a day (BID) | ORAL | Status: DC
  Filled 2011-11-03 (×2): qty 30

## 2011-11-03 MED ORDER — CLOPIDOGREL BISULFATE 75 MG PO TABS
75.0000 mg | ORAL_TABLET | Freq: Every day | ORAL | Status: DC
  Administered 2011-11-05 – 2011-11-14 (×10): 75 mg via ORAL
  Filled 2011-11-03 (×12): qty 1

## 2011-11-03 MED ORDER — SENNOSIDES-DOCUSATE SODIUM 8.6-50 MG PO TABS: 1.0000 | ORAL_TABLET | Freq: Every day | ORAL | Status: DC | PRN

## 2011-11-03 MED ORDER — LORAZEPAM 2 MG/ML IJ SOLN
2.0000 mg | Freq: Once | INTRAMUSCULAR | Status: DC
  Filled 2011-11-03: qty 1

## 2011-11-03 MED ORDER — TAMSULOSIN HCL 0.4 MG PO CAPS
0.4000 mg | ORAL_CAPSULE | Freq: Every day | ORAL | Status: DC
  Filled 2011-11-03: qty 1

## 2011-11-03 MED ORDER — ZINC SULFATE 220 (50 ZN) MG PO CAPS: 220.0000 mg | ORAL_CAPSULE | Freq: Every evening | ORAL | Status: DC

## 2011-11-03 MED ORDER — MORPHINE SULFATE 2 MG/ML IJ SOLN
1.0000 mg | INTRAMUSCULAR | Status: DC | PRN
  Administered 2011-11-04 – 2011-11-07 (×5): 1 mg via INTRAVENOUS
  Filled 2011-11-03 (×5): qty 1

## 2011-11-03 MED ORDER — INSULIN GLARGINE 100 UNIT/ML ~~LOC~~ SOLN
5.0000 [IU] | Freq: Every day | SUBCUTANEOUS | Status: DC
  Filled 2011-11-03: qty 3

## 2011-11-03 MED ORDER — SPIRONOLACTONE 25 MG PO TABS: 25.0000 mg | ORAL_TABLET | Freq: Every day | ORAL | Status: DC

## 2011-11-03 MED ORDER — METAXALONE 400 MG HALF TABLET
400.0000 mg | ORAL_TABLET | Freq: Three times a day (TID) | ORAL | Status: DC | PRN
  Filled 2011-11-03: qty 1

## 2011-11-03 NOTE — ED Notes (Signed)
Family at bedside. Admitting MD at bedside. 

## 2011-11-03 NOTE — ED Notes (Signed)
Family at bedside. 

## 2011-11-03 NOTE — ED Notes (Signed)
Dr. Thedore Mins notified & aware of pt lethargic, slurred speech, less responsive, VSS. Stated will order EEG.

## 2011-11-03 NOTE — ED Notes (Signed)
Report called to floor, informed to bring pt to floor after 1930

## 2011-11-03 NOTE — ED Notes (Signed)
Family at bedside. Informed of status. Daughter reports pt almost back to normal. Speaking & alert to person

## 2011-11-03 NOTE — ED Notes (Signed)
Returned from CT scan. RRT remains with pt

## 2011-11-03 NOTE — ED Notes (Signed)
Pt normally oriented only to person. Has contractures BLE.

## 2011-11-03 NOTE — H&P (Addendum)
[name] [phone] Outpatient Primary MD for the patient is [name], MD  With History of - Principal Problem:  *Mental status, decreased Active Problems:  Atherosclerosis of native arteries of the extremities with ulceration  CVA, old, hemiparesis  CHF, left ventricular Ef 40-45 %  Complete heart block  Artificial pacemaker  DM type 2 causing CKD stage 3  Weakness generalized   Past Medical History  Diagnosis Date  . Diabetes mellitus   . Hypertension   . CHF (congestive heart failure)   . CAD (coronary artery disease)   . Pacemaker   . TIA (transient ischemic attack)   . Stroke   . Hyperlipidemia   . Spinal stenosis   . DJD (degenerative joint disease)   . Peripheral vascular disease   . Hemorrhoids   . Carotid artery occlusion   . Dysphagia S/P CVA (cerebrovascular accident)      Past Surgical History  Procedure Date  . Pacemaker placement    P in for   Chief Complaint  Patient presents with  . Code Stroke      HPI  [name] [id number] is a 77 y.o. male, with H/O L.Hemiperesis due to a old CVA, now living at a SNF for few months with Chr Hip pain and in poor functional status, has developed leg ulcers, who has been on PO pain meds and muscle relaxants who has had 3 visits now for Dec Mental status and has been worked up for TIA, comes today after receiving his pain meds with another episode of Dec mental status/unresponsive state, was seen by Neuro Dr Lyman Speller in ER had a stat CT head-Neck Angio with No acute finding, Dr Lyman Speller recommends no TIA-CVA but Med OD, pt now close to baseline per family, with no New F.N deficits. In ER mild Leukocytosis, UA and CXR ordered.   Review of Systems    In addition to the HPI above,  No Fever-chills, No Headache, No changes with Vision or hearing, No problems swallowing food or Liquids, No Chest pain, Cough or Shortness of Breath, No Abdominal pain, No Nausea or  Vommitting, Bowel movements are regular, No Blood in stool or Urine, No dysuria, No new skin rashes or bruises, No new joints pains-aches, except bilateral hip pain for 2-3 months No new weakness, tingling, numbness in any extremity, No recent weight gain or loss, No polyuria, polydypsia or polyphagia, No significant Mental Stressors.  A full 10 point Review of Systems was done, except as stated above, all other Review of Systems were negative.   Social History History  Substance Use Topics  . Smoking status: Former Games developer  . Smokeless tobacco: Not on file  . Alcohol Use: No      Family History Colon CA   Prior to Admission medications   Medication Sig Start Date End Date Taking? Authorizing Provider  aspirin 81 MG chewable tablet Chew 81 mg by mouth daily.     Yes Historical Provider, MD  atorvastatin (LIPITOR) 20 MG tablet Take 20 mg by mouth at bedtime.    Yes Historical Provider, MD  baclofen (LIORESAL) 10 MG tablet Take 1 tablet (10 mg total) by mouth 3 (three) times daily. 10/30/11 11/29/11 Yes [name]  carvedilol (COREG) 12.5 MG tablet Take 12.5 mg by mouth 2 (two) times daily with a meal.     Yes Historical Provider, MD  cefUROXime (CEFTIN) 500 MG tablet Take 1 tablet (500 mg total) by mouth 2 (two) times daily with a meal. 10/30/11 11/13/11  Yes [name]  clopidogrel (PLAVIX) 75 MG tablet Take 75 mg by mouth daily.    Yes Historical Provider, MD  docusate sodium 100 MG CAPS Take 100 mg by mouth 2 (two) times daily. 10/30/11 11/09/11 Yes [name]  finasteride (PROSCAR) 5 MG tablet Take 5 mg by mouth at bedtime.    Yes Historical Provider, MD  insulin aspart (NOVOLOG) 100 UNIT/ML injection Inject 1-9 Units into the skin 3 (three) times daily with meals. Per sliding scale   Yes Historical Provider, MD  insulin glargine (LANTUS) 100 UNIT/ML injection Inject 15 Units into the skin at bedtime.   10/30/11  Yes [name]  levofloxacin (LEVAQUIN) 500 MG tablet Take 500 mg by  mouth daily. 7 day course. Started on 10/30/11 10/30/11 11/05/11 Yes Historical Provider, MD  lisinopril (PRINIVIL,ZESTRIL) 5 MG tablet Take 5 mg by mouth daily.     Yes Historical Provider, MD  metaxalone (SKELAXIN) 800 MG tablet Take 800 mg by mouth 3 (three) times daily as needed. For muscle spasms   Yes Historical Provider, MD  mirtazapine (REMERON) 7.5 MG tablet Take 7.5 mg by mouth at bedtime.    Yes Historical Provider, MD  Multiple Vitamins-Minerals (ICAPS) CAPS Take 1 capsule by mouth daily.    Yes Historical Provider, MD  oxyCODONE-acetaminophen (PERCOCET) 5-325 MG per tablet Take 2 tablets by mouth every 4 (four) hours as needed. For pain   Yes Historical Provider, MD  protein supplement (PROSOURCE NO CARB) LIQD Take 30 mLs by mouth 2 (two) times daily.     Yes Historical Provider, MD  spironolactone (ALDACTONE) 25 MG tablet Take 25 mg by mouth daily.     Yes Historical Provider, MD  Tamsulosin HCl (FLOMAX) 0.4 MG CAPS Take 0.4 mg by mouth at bedtime.    Yes Historical Provider, MD  zinc sulfate 220 MG capsule Take 220 mg by mouth every evening.    Yes Historical Provider, MD  Nutritional Supplements (BOOST GLUCOSE CONTROL) LIQD Take 1 Can by mouth 3 (three) times daily with meals.      Historical Provider, MD  Sennosides (SENOKOT PO) Take 2 tablets by mouth daily as needed. For constipation    Historical Provider, MD   No Known Allergies  Physical Exam No intake or output data in the 24 hours ending 11/03/11 1709 1. Blood pressure 161/54, pulse 76, resp. rate 20, SpO2 97.00%. General frail African American male  lying in bed in NAD, stooped to left side wearing bilateral leg foot splint-boots, legs with early contractures  2. Normal affect and insight, Not Suicidal or Homicidal, Awake Alert, Oriented *3.  3. No F.N deficits, ALL C.Nerves Intact, Strength 4/5 all 4 extremities, Sensation intact all 4 extremities, Plantars down going.Chr L facial droop and L sided weakness as compared  to the Rt side  4. Ears and Eyes appear Normal, Conjunctivae clear, PERRLA. Moist Oral Mucosa.  5. Supple Neck, No JVD, No cervical lymphadenopathy appriciated, No Carotid Bruits.  6. Symmetrical Chest wall movement, Good air movement bilaterally, CTAB.  7. RRR, No Gallops, Rubs or Murmurs, No Parasternal Heave.  8. Positive Bowel Sounds, Abdomen Soft, Non tender, No organomegaly appriciated,  No rebound -guarding or rigidity.  9. No Cyanosis, Normal Skin Turgor, No Skin Rash or Bruise.  10. Both legs feet in splint-boots.  11. No Palpable Lymph Nodes in Neck or Axillae      Data Review  CBC w Diff:  Lab Results  Component Value Date   WBC 14.1* 11/03/2011  HGB 8.9* 11/03/2011   HCT 27.5* 11/03/2011   PLT 360 11/03/2011   LYMPHOPCT 12 11/03/2011   MONOPCT 7 11/03/2011   EOSPCT 3 11/03/2011   BASOPCT 0 11/03/2011    CMP:  Lab Results  Component Value Date   NA 136 11/03/2011   K 4.9 11/03/2011   CL 101 11/03/2011   CO2 28 11/03/2011   BUN 18 11/03/2011   CREATININE 0.96 11/03/2011   PROT 6.6 11/03/2011   ALBUMIN 2.1* 11/03/2011   BILITOT 0.4 11/03/2011   ALKPHOS 136* 11/03/2011   AST 45* 11/03/2011   ALT 41 11/03/2011    Coagulation:  Lab Results  Component Value Date   INR 1.33 11/03/2011    Cardiac markers:  Lab Results  Component Value Date   CKMB 11.0* 11/03/2011   TROPONINI <0.30 11/03/2011    Ct Angio Neck W/cm &/or Wo/cm  11/03/2011  *RADIOLOGY REPORT*  Clinical Data:  Question CVA.  The patient is unresponsive.  The patient received morphine the.  At the time of the exam, the patient had become responsive again.  CT ANGIOGRAPHY HEAD AND NECK  Technique:  Multidetector CT imaging of the head and neck was performed using the standard protocol during bolus administration of intravenous contrast.    IMPRESSION:   1.  Calcification at the origin of the right vertebral artery with a probable high-grade stenosis. 2.  Marginal calcification at the origin of the left  vertebral artery without significant proximal stenosis. 3.  Tandem stenoses are present in the proximal left vertebral artery as the vessel enters the foramen transversarium. 4.  Atherosclerotic calcifications and noncalcified plaque at the carotid bifurcations bilaterally without significant stenosis. 5.  Moderate tortuosity of the innominate and left common carotid arteries.    CTA HEAD    IMPRESSION:   1.  Moderate small vessel disease. 2.  Focal stenosis of the distal right M1 segment. 3.  Dense atherosclerotic calcifications involving the cavernous carotid arteries bilaterally with left greater than right moderate stenoses. 4.  50% stenosis of the basilar artery. 5.  Atherosclerotic calcifications at the dural margins of the vertebral arteries bilaterally. 6.  Diffuse white matter hypoattenuation, compatible with small vessel disease.  Original Report Authenticated By: Jamesetta Orleans. MATTERN, M.D.    Ct Lumbar Spine Wo Contrast  10/20/2011  *RADIOLOGY REPORT*  Clinical Data: 77 year old male with radiculopathy, possible lumbar stenosis.  CT LUMBAR SPINE WITHOUT CONTRAST  Technique:  Multidetector CT imaging of the lumbar spine was performed without intravenous contrast administration. Multiplanar CT image reconstructions were also generated.  Comparison: CT abdomen and pelvis 09/20/2011.  Findings: Cardiac pacemaker leads partially visualized.  Stable visualized abdominal viscera including extensive calcified atherosclerosis, exophytic left renal cyst, and there has been some increase in the volume of retained stool in the colon.  Prior surgery to the L3-L4 and L4-L5 levels, see details below. Bone mineralization is within normal limits for age.  Degenerative ankylosis of the left SI joint.  Visualized sacrum is intact.  T11-T12:  Right far lateral disc osteophyte complex.  Mild ligament flavum hypertrophy.  No spinal or foraminal stenosis.  T12-L1:  Mild calcified circumferential disc bulge.  No  spinal or foraminal stenosis.  L1-L2:  Mild disc bulge.  Moderate facet hypertrophy greater on the right.  Bilateral vacuum facet phenomena.  No spinal stenosis. Mild right L1 foraminal stenosis.  L2-L3:  Mild to moderate circumferential disc bulge.  Moderate facet hypertrophy.  Vacuum facet phenomenon greater on the left. No spinal stenosis.  Mild bilateral L2 foraminal stenosis.  L3-L4:  Sequelae of decompression.  Severe residual facet hypertrophy. Vacuum facet hypertrophy on the left.  Circumferential disc bulge.  Ligament flavum hypertrophy greater on the right.  No spinal stenosis, but a degree of bilateral lateral recess stenosis is suspected.  There is also severe left and moderate right bilateral L3 foraminal stenosis.  L4-L5:  Sequelae of decompression.  Anterolisthesis measuring 4-5 mm.  Very severe residual facet hypertrophy.  Circumferential disc osteophyte complex.  No spinal stenosis, but there could be a bilateral lateral recess stenosis.  There is severe bilateral L4 foraminal stenosis, worse on the left.  L5-S1:  Circumferential disc bulge.  Mild facet hypertrophy.  No spinal stenosis.  Moderate left and severe right L5 foraminal stenosis.  IMPRESSION: 1.  Previous decompression at L3-L4 and L4-L5.  - At L4-L5 there is grade 1 spondylolisthesis with very severe residual facet hypertrophy and multifactorial severe bilateral L4 foraminal stenosis and possible lateral recess stenosis. - At L3-L4 there is residual facet is and degeneration with moderate to severe bilateral L3 foraminal stenosis and possible lateral recess stenosis. 2. Moderate to severe multifactorial bilateral L5 foraminal stenosis. 3.  No lumbar spinal stenosis.  Comparatively mild for age disc and facet degeneration elsewhere in the lumbar spine.  Original Report Authenticated By: Harley Hallmark, M.D.   Dg Chest Portable 1 View  My personal review of EKG: Rhythm paced rhythm 95/min  Personally reviewed Old Chart from last  admission similar ? TIA admission    Assessment & Plan  1. AMS - due to medication OD  In a patient with CVA and L.Hemiperesis - admit on Tele, Neuro checks, reduce pain meds and muscle relaxants, increase ASA to full dose, monitor. Asp precautions. PT consult.  Addendum - another drwosy spell in ER - EEG ordered,  D/W Neuro Dr Lyman Speller  Again. Keppra if symptoms again.  2. Chr Sytolic CHF post pacemaker - compensated -  fluid restriction - home meds B blocker and ACE + fluid restriction. Monitor need for lasix.  3. DM-2 Lants-ADA diet + ISS  4. Chr Hip pain with some worsening -check hip X ray ( recently stable CT)  5. Hell-foot ulcers - wound care, continue boots.  6. Leukocytosis - check UA and CXR.  IV Morphine for pain control DVT Prophylaxis   Heparin AM Labs Ordered, also please review Full Orders Admission, patients condition and plan of care including tests being ordered have been discussed with the patient and daughter who indicate understanding and agree with the plan and Code Status. Code Status Full  Condition GUARDED  The patient is being admitted to the Hospitalist Service.

## 2011-11-03 NOTE — Consult Note (Addendum)
Reason for Consult: Altered Mental Status  HPI: [name] is an 77 y.o. male. Who has had been here for repeated TIA like symptoms including very recently on Nov. 1st with right-sided facial droop and slurred speech that resolved. He also was here for similar complaints shortly before. These events were labeled as TIA's. Today he was found unresponsive around 12 pm by his nurse. He was last seen normal between 8 and 9 am. I saw him around 2 pm. His NIHSS was 18. His CT head was negative. It was decided that he came in out of IV t-PA window and CTA Head and Neck was obtained which did not show any thrombi that could be mechanically removed. Shortly after the patient returned to baseline. His daughter arrived and provided history that her father has had similar times in the past where he looked very much out of it and this was associated with morphine administration.   Past Medical History  Diagnosis Date  . Diabetes mellitus   . Hypertension   . CHF (congestive heart failure)   . CAD (coronary artery disease)   . Pacemaker   . TIA (transient ischemic attack)   . Stroke   . Hyperlipidemia   . Spinal stenosis   . DJD (degenerative joint disease)   . Peripheral vascular disease   . Hemorrhoids   . Carotid artery occlusion   . Dysphagia S/P CVA (cerebrovascular accident)     Past Surgical History  Procedure Date  . Pacemaker placement    No family history on file.  Social History:  reports that he has quit smoking. He does not have any smokeless tobacco history on file. He reports that he does not drink alcohol or use illicit drugs.  Allergies: No Known Allergies  Medications: I have reviewed the patient's current medications.  ROS: as above  Blood pressure 161/54, pulse 76, resp. rate 20, SpO2 97.00%. Neuro (initial): patient would just moan, but not look at examiner. He would squeeze my fingers with both hands, but did not complete more complex commands. He would stare  blankly ahead of him, but would blink to threat. He was aphasic and severely dysarthric. PERRL. No obvious facial asymmetry. He would feel pain due to pinch in all ext. Neuro (follow-up): began to talk and follow complex commands. EOMI. VFF. No face asymmetry. No dysarthria. strenght 5/5 in UE and 5/5 distally in LE, proximally legs are contracted, right proximal strength appeared to be 5/5 and left proximal strength was pain-limited.   Results for orders placed during the hospital encounter of 11/03/11 (from the past 48 hour(s))  POCT I-STAT, CHEM 8     Status: Abnormal   Collection Time   11/03/11  1:41 PM      Component Value Range Comment   Sodium 139  135 - 145 (mEq/L)    Potassium 4.8  3.5 - 5.1 (mEq/L)    Chloride 102  96 - 112 (mEq/L)    BUN 20  6 - 23 (mg/dL)    Creatinine, Ser 1.61  0.50 - 1.35 (mg/dL)    Glucose, Bld 096 (*) 70 - 99 (mg/dL)    Calcium, Ion 0.45  1.12 - 1.32 (mmol/L)    TCO2 27  0 - 100 (mmol/L)    Hemoglobin 9.9 (*) 13.0 - 17.0 (g/dL)    HCT 40.9 (*) 81.1 - 52.0 (%)   CBC     Status: Abnormal   Collection Time   11/03/11  2:19 PM  Component Value Range Comment   WBC 14.1 (*) 4.0 - 10.5 (K/uL)    RBC 3.13 (*) 4.22 - 5.81 (MIL/uL)    Hemoglobin 8.9 (*) 13.0 - 17.0 (g/dL)    HCT 16.1 (*) 09.6 - 52.0 (%)    MCV 87.9  78.0 - 100.0 (fL)    MCH 28.4  26.0 - 34.0 (pg)    MCHC 32.4  30.0 - 36.0 (g/dL)    RDW 04.5  40.9 - 81.1 (%)    Platelets 360  150 - 400 (K/uL)   DIFFERENTIAL     Status: Abnormal   Collection Time   11/03/11  2:19 PM      Component Value Range Comment   Neutrophils Relative 78 (*) 43 - 77 (%)    Neutro Abs 11.1 (*) 1.7 - 7.7 (K/uL)    Lymphocytes Relative 12  12 - 46 (%)    Lymphs Abs 1.6  0.7 - 4.0 (K/uL)    Monocytes Relative 7  3 - 12 (%)    Monocytes Absolute 1.1 (*) 0.1 - 1.0 (K/uL)    Eosinophils Relative 3  0 - 5 (%)    Eosinophils Absolute 0.4  0.0 - 0.7 (K/uL)    Basophils Relative 0  0 - 1 (%)    Basophils Absolute 0.0  0.0  - 0.1 (K/uL)   COMPREHENSIVE METABOLIC PANEL     Status: Abnormal   Collection Time   11/03/11  2:19 PM      Component Value Range Comment   Sodium 136  135 - 145 (mEq/L)    Potassium 4.9  3.5 - 5.1 (mEq/L)    Chloride 101  96 - 112 (mEq/L)    CO2 28  19 - 32 (mEq/L)    Glucose, Bld 133 (*) 70 - 99 (mg/dL)    BUN 18  6 - 23 (mg/dL)    Creatinine, Ser 9.14  0.50 - 1.35 (mg/dL)    Calcium 8.9  8.4 - 10.5 (mg/dL)    Total Protein 6.6  6.0 - 8.3 (g/dL)    Albumin 2.1 (*) 3.5 - 5.2 (g/dL)    AST 45 (*) 0 - 37 (U/L)    ALT 41  0 - 53 (U/L)    Alkaline Phosphatase 136 (*) 39 - 117 (U/L)    Total Bilirubin 0.4  0.3 - 1.2 (mg/dL)    GFR calc non Af Amer 75 (*) >90 (mL/min)    GFR calc Af Amer 87 (*) >90 (mL/min)   APTT     Status: Normal   Collection Time   11/03/11  2:19 PM      Component Value Range Comment   aPTT 36  24 - 37 (seconds)   PROTIME-INR     Status: Abnormal   Collection Time   11/03/11  2:19 PM      Component Value Range Comment   Prothrombin Time 16.7 (*) 11.6 - 15.2 (seconds)    INR 1.33  0.00 - 1.49    CK TOTAL AND CKMB     Status: Abnormal   Collection Time   11/03/11  2:19 PM      Component Value Range Comment   Total CK 238 (*) 7 - 232 (U/L)    CK, MB 11.0 (*) 0.3 - 4.0 (ng/mL) CRITICAL VALUE NOTED.  VALUE IS CONSISTENT WITH PREVIOUSLY REPORTED AND CALLED VALUE.   Relative Index 4.6 (*) 0.0 - 2.5    TROPONIN I     Status: Normal  Collection Time   11/03/11  2:19 PM      Component Value Range Comment   Troponin I <0.30  <0.30 (ng/mL)     Ct Angio Head W/cm &/or Wo Cm  11/03/2011  *RADIOLOGY REPORT*  Clinical Data:  Question CVA.  The patient is unresponsive.  The patient received morphine the.  At the time of the exam, the patient had become responsive again.  CT ANGIOGRAPHY HEAD AND NECK  Technique:  Multidetector CT imaging of the head and neck was performed using the standard protocol during bolus administration of intravenous contrast.  Multiplanar CT image  reconstructions including MIPs were obtained to evaluate the vascular anatomy. Carotid stenosis measurements (when applicable) are obtained utilizing NASCET criteria, using the distal internal carotid diameter as the denominator.  Contrast: 50mL OMNIPAQUE IOHEXOL 350 MG/ML IV SOLN  Comparison:  CT head without contrast 11/03/2011.  CTA NECK  Findings:  A standard three-vessel arch configuration is present. There is marked tortuosity of the innominate artery without significant stenosis.  Both vertebral arteries originate from the subclavian arteries. There is dense calcification at the origin of the nondominant right vertebral artery, suggesting a significant stenosis.  No other focal stenosis is present in the neck.  There are some atherosclerotic calcifications at the dural margin.  Marginal calcifications are present at the proximal left vertebral artery without significant stenosis.  There is a stenosis prior to the artery entering the vertebral canal.  This measures proximally 50%. A second stenosis is present at the level where the artery does anterior the foramen transversarium, also measuring approximately 50%.  The left vertebral artery is then within normal limits above that level.  It is the dominant vessel and to the head.  The right common carotid artery is tortuous, but without significant stenosis.  Atherosclerotic calcifications are present at the right carotid bifurcation.  There is no significant stenosis relative to the distal vessel.  The left common carotid artery is tortuous proximally, but without significant stenosis.  Calcified and noncalcified plaque is present in the proximal left internal carotid artery without significant stenosis.  The soft tissues of the neck are otherwise unremarkable.  The lung apices are clear.  Degenerative changes are present throughout the cervical spine.   Review of the MIP images confirms the above findings.  IMPRESSION:  1.  Calcification at the origin of the  right vertebral artery with a probable high-grade stenosis. 2.  Marginal calcification at the origin of the left vertebral artery without significant proximal stenosis. 3.  Tandem stenoses are present in the proximal left vertebral artery as the vessel enters the foramen transversarium. 4.  Atherosclerotic calcifications and noncalcified plaque at the carotid bifurcations bilaterally without significant stenosis. 5.  Moderate tortuosity of the innominate and left common carotid arteries.  CTA HEAD  Findings:  The source images demonstrate no acute cortical infarction.  The postcontrast images demonstrate no areas of pathologic enhancement. Moderate periventricular white matter hypoattenuation is compatible small vessel disease.  A remote lacunar infarct is noted in the right cerebellum.  There are lacunar infarcts involving the right thalamus as well, likely remote.  The right cavernous carotid artery is heavily calcified.  The true lumen is narrowed to 2 mm. The distal lumen measures 2.4 mm.  Dense calcifications are noted within the left cavernous carotid artery as well.  The distal lumen is narrowed to 1 mm.  This compares with a more normal distal vessel of 3 mm.  The supraclinoid internal carotid arteries are  unremarkable.  The A1 segments are normal bilaterally.  There is focal stenosis in the distal right M1 segment at the level of bifurcation.  There is irregularity in the distal left M1 segment.  A high-grade stenosis is present in the proximal inferior right M1 segment left M1 segment.  There is segmental stenoses in the more distal branches. Irregularities are noted throughout the ACA branches without a significant proximal stenosis or occlusion.  Multiple areas of segmental irregularity are present within the left MCA branch vessels without a significant proximal stenosis or occlusion.  The vertebral basilar junction is within normal limits.  The basilar artery is small and tortuous.  There is a 50%  stenosis in the mid basilar segment.  Both posterior cerebral arteries originate from basilar tip.  There is significant segmental irregularity within and PCA branch vessels bilaterally.  The dural sinuses are patent.  No focal enhancement is seen.   Review of the MIP images confirms the above findings.  IMPRESSION:  1.  Moderate small vessel disease. 2.  Focal stenosis of the distal right M1 segment. 3.  Dense atherosclerotic calcifications involving the cavernous carotid arteries bilaterally with left greater than right moderate stenoses. 4.  50% stenosis of the basilar artery. 5.  Atherosclerotic calcifications at the dural margins of the vertebral arteries bilaterally. 6.  Diffuse white matter hypoattenuation, compatible with small vessel disease.  Original Report Authenticated By: Jamesetta Orleans. MATTERN, M.D.   Ct Head Wo Contrast  11/03/2011  *RADIOLOGY REPORT*  Clinical Data: Aphasia.  CT HEAD WITHOUT CONTRAST  Technique:  Contiguous axial images were obtained from the base of the skull through the vertex without contrast.  Comparison: 10/25/2011.  Findings: No intracranial hemorrhage.  Remote right cerebellar infarct and right sub insular infarct. Prominent small vessel disease type changes. No CT evidence of large acute infarct.  Small acute infarct cannot be excluded by CT.  No intracranial mass lesion detected on this unenhanced exam.  Global atrophy without hydrocephalus.  Vascular calcifications.  IMPRESSION: No intracranial hemorrhage or CT evidence of large acute infarct.  Remote infarcts and small vessel disease type changes.  Critical Value/emergent results were called by telephone at the time of interpretation on 11/03/2011  at 2:00 p.m.  to  [facility] who verbally acknowledged these results.  Original Report Authenticated By: Fuller Canada, M.D.   Ct Angio Neck W/cm &/or Wo/cm  11/03/2011  *RADIOLOGY REPORT*  Clinical Data:  Question CVA.  The patient is unresponsive.   The patient received morphine the.  At the time of the exam, the patient had become responsive again.  CT ANGIOGRAPHY HEAD AND NECK  Technique:  Multidetector CT imaging of the head and neck was performed using the standard protocol during bolus administration of intravenous contrast.  Multiplanar CT image reconstructions including MIPs were obtained to evaluate the vascular anatomy. Carotid stenosis measurements (when applicable) are obtained utilizing NASCET criteria, using the distal internal carotid diameter as the denominator.  Contrast: 50mL OMNIPAQUE IOHEXOL 350 MG/ML IV SOLN  Comparison:  CT head without contrast 11/03/2011.  CTA NECK  Findings:  A standard three-vessel arch configuration is present. There is marked tortuosity of the innominate artery without significant stenosis.  Both vertebral arteries originate from the subclavian arteries. There is dense calcification at the origin of the nondominant right vertebral artery, suggesting a significant stenosis.  No other focal stenosis is present in the neck.  There are some atherosclerotic calcifications at the dural margin.  Marginal calcifications are  present at the proximal left vertebral artery without significant stenosis.  There is a stenosis prior to the artery entering the vertebral canal.  This measures proximally 50%. A second stenosis is present at the level where the artery does anterior the foramen transversarium, also measuring approximately 50%.  The left vertebral artery is then within normal limits above that level.  It is the dominant vessel and to the head.  The right common carotid artery is tortuous, but without significant stenosis.  Atherosclerotic calcifications are present at the right carotid bifurcation.  There is no significant stenosis relative to the distal vessel.  The left common carotid artery is tortuous proximally, but without significant stenosis.  Calcified and noncalcified plaque is present in the proximal left  internal carotid artery without significant stenosis.  The soft tissues of the neck are otherwise unremarkable.  The lung apices are clear.  Degenerative changes are present throughout the cervical spine.   Review of the MIP images confirms the above findings.  IMPRESSION:  1.  Calcification at the origin of the right vertebral artery with a probable high-grade stenosis. 2.  Marginal calcification at the origin of the left vertebral artery without significant proximal stenosis. 3.  Tandem stenoses are present in the proximal left vertebral artery as the vessel enters the foramen transversarium. 4.  Atherosclerotic calcifications and noncalcified plaque at the carotid bifurcations bilaterally without significant stenosis. 5.  Moderate tortuosity of the innominate and left common carotid arteries.  CTA HEAD  Findings:  The source images demonstrate no acute cortical infarction.  The postcontrast images demonstrate no areas of pathologic enhancement. Moderate periventricular white matter hypoattenuation is compatible small vessel disease.  A remote lacunar infarct is noted in the right cerebellum.  There are lacunar infarcts involving the right thalamus as well, likely remote.  The right cavernous carotid artery is heavily calcified.  The true lumen is narrowed to 2 mm. The distal lumen measures 2.4 mm.  Dense calcifications are noted within the left cavernous carotid artery as well.  The distal lumen is narrowed to 1 mm.  This compares with a more normal distal vessel of 3 mm.  The supraclinoid internal carotid arteries are unremarkable.  The A1 segments are normal bilaterally.  There is focal stenosis in the distal right M1 segment at the level of bifurcation.  There is irregularity in the distal left M1 segment.  A high-grade stenosis is present in the proximal inferior right M1 segment left M1 segment.  There is segmental stenoses in the more distal branches. Irregularities are noted throughout the ACA branches  without a significant proximal stenosis or occlusion.  Multiple areas of segmental irregularity are present within the left MCA branch vessels without a significant proximal stenosis or occlusion.  The vertebral basilar junction is within normal limits.  The basilar artery is small and tortuous.  There is a 50% stenosis in the mid basilar segment.  Both posterior cerebral arteries originate from basilar tip.  There is significant segmental irregularity within and PCA branch vessels bilaterally.  The dural sinuses are patent.  No focal enhancement is seen.   Review of the MIP images confirms the above findings.  IMPRESSION:  1.  Moderate small vessel disease. 2.  Focal stenosis of the distal right M1 segment. 3.  Dense atherosclerotic calcifications involving the cavernous carotid arteries bilaterally with left greater than right moderate stenoses. 4.  50% stenosis of the basilar artery. 5.  Atherosclerotic calcifications at the dural margins of the vertebral arteries  bilaterally. 6.  Diffuse white matter hypoattenuation, compatible with small vessel disease.  Original Report Authenticated By: Jamesetta Orleans. MATTERN, M.D.   Assessment/Plan: 77 years old man who came in likely very lethargic due to morphine. No evidence of CVA on CT or CTA. No evidence of a thrombus. The event was not consistent with seizure and has recurred in the past in association with opiates. 1) Please ensure that patient is not oversedated. Perhaps tylenol #3 or tramadol would be adequate for pain relief. 2) Call with questions.  Emmilee Reamer 11/03/2011, 4:23 PM   Addendum: I spoke with Dr. Thedore Mins and agree with EEG to determine if these recurrent TIA's may in fact be seizures. I would probably recommend Keppra 500 mg PO bid even if EEG is not suggestive of epilepsy as patient has had stroke work up several times now without any findings.  Carmell Austria, 11/03/2011 7:04 pm

## 2011-11-03 NOTE — ED Provider Notes (Addendum)
History    84yM brought in by [facility]Ms for possible stroke. Found by staff poorrly responsive and with dysarthria or possible aphasia. Last seen at baseline around 8-9am. No report of trauma. No fever. Hx of prior cva. CSN: [id number] Arrival date & time: 11/03/2011  1:48 PM   First MD Initiated Contact with Patient 11/03/11 1407      Chief Complaint  Patient presents with  . Code Stroke    (Consider location/radiation/quality/duration/timing/severity/associated sxs/prior treatment) HPI  Past Medical History  Diagnosis Date  . Diabetes mellitus   . Hypertension   . CHF (congestive heart failure)   . CAD (coronary artery disease)   . Pacemaker   . TIA (transient ischemic attack)   . Stroke   . Hyperlipidemia   . Spinal stenosis   . DJD (degenerative joint disease)   . Peripheral vascular disease   . Hemorrhoids   . Carotid artery occlusion   . Dysphagia S/P CVA (cerebrovascular accident)     Past Surgical History  Procedure Date  . Pacemaker placement     No family history on file.  History  Substance Use Topics  . Smoking status: Former Games developer  . Smokeless tobacco: Not on file  . Alcohol Use: No      Review of Systems  Unable to perform ROS Unable 2/2 decreased mental status,  Allergies  Review of patient's allergies indicates no known allergies.  Home Medications   Current Outpatient Rx  Name Route Sig Dispense Refill  . ASPIRIN 81 MG PO CHEW Oral Chew 81 mg by mouth daily.      . ATORVASTATIN CALCIUM 20 MG PO TABS Oral Take 20 mg by mouth at bedtime.     Marland Kitchen BACLOFEN 10 MG PO TABS Oral Take 1 tablet (10 mg total) by mouth 3 (three) times daily. 30 each   . CARVEDILOL 12.5 MG PO TABS Oral Take 12.5 mg by mouth 2 (two) times daily with a meal.      . CEFUROXIME AXETIL 500 MG PO TABS Oral Take 1 tablet (500 mg total) by mouth 2 (two) times daily with a meal. 8 tablet 0  . CLOPIDOGREL BISULFATE 75 MG PO TABS Oral Take 75 mg by mouth daily.     . DSS 100 MG PO  CAPS Oral Take 100 mg by mouth 2 (two) times daily. 10 capsule   . FINASTERIDE 5 MG PO TABS Oral Take 5 mg by mouth at bedtime.     . INSULIN ASPART 100 UNIT/ML [place] SOLN Subcutaneous Inject 1-9 Units into the skin 3 (three) times daily with meals. Per sliding scale    . INSULIN GLARGINE 100 UNIT/ML [place] SOLN Subcutaneous Inject 15 Units into the skin at bedtime.      Marland Kitchen LEVOFLOXACIN 500 MG PO TABS Oral Take 500 mg by mouth daily. 7 day course. Started on 10/30/11    . LISINOPRIL 5 MG PO TABS Oral Take 5 mg by mouth daily.      Marland Kitchen METAXALONE 800 MG PO TABS Oral Take 800 mg by mouth 3 (three) times daily as needed. For muscle spasms    . MIRTAZAPINE 7.5 MG PO TABS Oral Take 7.5 mg by mouth at bedtime.     . ICAPS PO CAPS Oral Take 1 capsule by mouth daily.     . OXYCODONE-ACETAMINOPHEN 5-325 MG PO TABS Oral Take 2 tablets by mouth every 4 (four) hours as needed. For pain    . PROSOURCE NO CARB PO LIQD Oral Take  30 mLs by mouth 2 (two) times daily.      Marland Kitchen SPIRONOLACTONE 25 MG PO TABS Oral Take 25 mg by mouth daily.      Marland Kitchen TAMSULOSIN HCL 0.4 MG PO CAPS Oral Take 0.4 mg by mouth at bedtime.     Marland Kitchen ZINC SULFATE 220 MG PO CAPS Oral Take 220 mg by mouth every evening.     Marland Kitchen BOOST GLUCOSE CONTROL PO LIQD Oral Take 1 Can by mouth 3 (three) times daily with meals.      . SENOKOT PO Oral Take 2 tablets by mouth daily as needed. For constipation      BP 153/47  Pulse 64  Resp 17  SpO2 97%  Physical Exam  Constitutional: He appears distressed.  Eyes: Conjunctivae are normal. Pupils are equal, round, and reactive to light. Right eye exhibits no discharge. Left eye exhibits no discharge. No scleral icterus.  Neck: No tracheal deviation present.  Cardiovascular: Normal rate and regular rhythm.   Pulmonary/Chest: Breath sounds normal. No stridor. He has no wheezes. He has no rales.  Abdominal: Soft. He exhibits no distension. There is no tenderness.  Musculoskeletal: He exhibits no tenderness.  Neurological:         GC10: E3, V2, M5. Difficult to adequately asses motor and sensory. Does appear to be moving all extremities though. eom appear intact. l facial droop.  Skin: Skin is warm and dry. He is not diaphoretic.    ED Course  Procedures (including critical care time)  Labs Reviewed  POCT I-STAT, CHEM 8 - Abnormal; Notable for the following:    Glucose, Bld 135 (*)    Hemoglobin 9.9 (*)    HCT 29.0 (*)    All other components within normal limits  CBC - Abnormal; Notable for the following:    WBC 14.1 (*)    RBC 3.13 (*)    Hemoglobin 8.9 (*)    HCT 27.5 (*)    All other components within normal limits  DIFFERENTIAL - Abnormal; Notable for the following:    Neutrophils Relative 78 (*)    Neutro Abs 11.1 (*)    Monocytes Absolute 1.1 (*)    All other components within normal limits  COMPREHENSIVE METABOLIC PANEL - Abnormal; Notable for the following:    Glucose, Bld 133 (*)    Albumin 2.1 (*)    AST 45 (*)    Alkaline Phosphatase 136 (*)    GFR calc non Af Amer 75 (*)    GFR calc Af Amer 87 (*)    All other components within normal limits  PROTIME-INR - Abnormal; Notable for the following:    Prothrombin Time 16.7 (*)    All other components within normal limits  CK TOTAL AND CKMB - Abnormal; Notable for the following:    Total CK 238 (*)    CK, MB 11.0 (*) CRITICAL VALUE NOTED.  VALUE IS CONSISTENT WITH PREVIOUSLY REPORTED AND CALLED VALUE.   Relative Index 4.6 (*)    All other components within normal limits  APTT  TROPONIN I   Ct Head Wo Contrast  11/03/2011  *RADIOLOGY REPORT*  Clinical Data: Aphasia.  CT HEAD WITHOUT CONTRAST  Technique:  Contiguous axial images were obtained from the base of the skull through the vertex without contrast.  Comparison: 10/25/2011.  Findings: No intracranial hemorrhage.  Remote right cerebellar infarct and right sub insular infarct. Prominent small vessel disease type changes. No CT evidence of large acute infarct.  Small acute infarct  cannot be excluded by CT.  No intracranial mass lesion detected on this unenhanced exam.  Global atrophy without hydrocephalus.  Vascular calcifications.  IMPRESSION: No intracranial hemorrhage or CT evidence of large acute infarct.  Remote infarcts and small vessel disease type changes.  Critical Value/emergent results were called by telephone at the time of interpretation on 11/03/2011  at 2:00 p.m.  to  [facility] who verbally acknowledged these results.  Original Report Authenticated By: Fuller Canada, M.D.   EKG:  Rhythm: atrial sensed. V-paced. irregular Rate: 95 Axis: indeterminant Intervals:normal pr, long qt ST segments: ns st changes  1. Overdose of opiate or related narcotic   2. Mental status, decreased   3. Acute confusion   4. TIA (transient ischemic attack)         MDM  84yM with decreased mental status. Stroke alert called. Evaluated by neurology and thought secondary to narcotic pain meds. Pt with hx of similar episodes. Pt improved back to baseline while in Ed per daughter's report. Admit for further observation and eval.        Raeford Razor, MD 11/09/11 0201  Raeford Razor, MD 11/09/11 [id number]

## 2011-11-03 NOTE — ED Notes (Signed)
MD at bedside. Admitting MD at bedside

## 2011-11-03 NOTE — ED Notes (Signed)
From [facility] - staff reports pt became aphasic, not responding to verbal stimuli.  Staff also states has Hx CVA, dysphagia, left side weakness & has episodes of garbled speech. Emesis x1 enroute .

## 2011-11-03 NOTE — ED Notes (Signed)
MD at bedside asssessing pt

## 2011-11-03 NOTE — ED Notes (Signed)
Dr. Lyman Speller remains at bedside with pt in CT scan

## 2011-11-03 NOTE — ED Notes (Signed)
Dr. kohut gave verbal order to in/out cath this patient. 

## 2011-11-03 NOTE — ED Notes (Signed)
Code stroke cancelled per Dr. Lyman Speller

## 2011-11-03 NOTE — ED Notes (Signed)
Pt presently in CT scan with RRT nurse

## 2011-11-03 NOTE — ED Notes (Signed)
Patient is resting comfortably. 

## 2011-11-03 NOTE — ED Notes (Signed)
MD at bedside. 

## 2011-11-04 ENCOUNTER — Inpatient Hospital Stay (HOSPITAL_COMMUNITY): Payer: Medicare Other

## 2011-11-04 LAB — BASIC METABOLIC PANEL
BUN: 15 mg/dL (ref 6–23)
Chloride: 104 mEq/L (ref 96–112)
GFR calc non Af Amer: 78 mL/min — ABNORMAL LOW (ref >90)
Glucose, Bld: 85 mg/dL (ref 70–99)
Potassium: 4.2 mEq/L (ref 3.5–5.1)

## 2011-11-04 LAB — CBC
HCT: 28 % — ABNORMAL LOW (ref 39.0–52.0)
Hemoglobin: 9 g/dL — ABNORMAL LOW (ref 13.0–17.0)
MCH: 28.2 pg (ref 26.0–34.0)
MCHC: 32.1 g/dL (ref 30.0–36.0)

## 2011-11-04 LAB — GLUCOSE, CAPILLARY
Glucose-Capillary: 100 mg/dL — ABNORMAL HIGH (ref 70–99)
Glucose-Capillary: 85 mg/dL (ref 70–99)
Glucose-Capillary: 86 mg/dL (ref 70–99)
Glucose-Capillary: 149 mg/dL — ABNORMAL HIGH (ref 70–99)

## 2011-11-04 MED ORDER — KETOROLAC TROMETHAMINE 15 MG/ML IJ SOLN
15.0000 mg | Freq: Once | INTRAMUSCULAR | Status: AC
  Administered 2011-11-04: 15 mg via INTRAVENOUS
  Filled 2011-11-04: qty 1

## 2011-11-04 MED ORDER — PIPERACILLIN-TAZOBACTAM 3.375 G IVPB
3.3750 g | Freq: Three times a day (TID) | INTRAVENOUS | Status: DC
  Administered 2011-11-04 – 2011-11-07 (×9): 3.375 g via INTRAVENOUS
  Filled 2011-11-04 (×10): qty 50

## 2011-11-04 MED ORDER — METOPROLOL TARTRATE 1 MG/ML IV SOLN
5.0000 mg | Freq: Four times a day (QID) | INTRAVENOUS | Status: DC | PRN
  Filled 2011-11-04: qty 5

## 2011-11-04 MED ORDER — DEXTROSE 50 % IV SOLN
50.0000 mL | Freq: Once | INTRAVENOUS | Status: AC
  Administered 2011-11-04: 50 mL via INTRAVENOUS
  Filled 2011-11-04: qty 50

## 2011-11-04 MED ORDER — SODIUM CHLORIDE 0.9 % IV SOLN
INTRAVENOUS | Status: DC
  Administered 2011-11-04: 11:00:00 via INTRAVENOUS

## 2011-11-04 MED ORDER — DEXTROSE-NACL 5-0.45 % IV SOLN
INTRAVENOUS | Status: DC
  Administered 2011-11-04: 14:00:00 via INTRAVENOUS

## 2011-11-04 MED ORDER — VANCOMYCIN HCL IN DEXTROSE 1-5 GM/200ML-% IV SOLN
1000.0000 mg | Freq: Two times a day (BID) | INTRAVENOUS | Status: DC
  Administered 2011-11-04 – 2011-11-07 (×6): 1000 mg via INTRAVENOUS
  Filled 2011-11-04 (×7): qty 200

## 2011-11-04 MED ORDER — DEXTROSE 50 % IV SOLN
INTRAVENOUS | Status: AC
  Administered 2011-11-04: 50 mL via INTRAVENOUS
  Filled 2011-11-04: qty 50

## 2011-11-04 MED ORDER — ONDANSETRON HCL 4 MG/2ML IJ SOLN: 4.0000 mg | Freq: Three times a day (TID) | INTRAMUSCULAR | Status: AC | PRN

## 2011-11-04 MED ORDER — FENTANYL 25 MCG/HR TD PT72
25.0000 ug | MEDICATED_PATCH | TRANSDERMAL | Status: DC
  Administered 2011-11-04: 25 ug via TRANSDERMAL
  Filled 2011-11-04: qty 1

## 2011-11-04 NOTE — Progress Notes (Signed)
11/04/11  1830 pt did poorly with minced diet, changed to dys I pureed diet with thin liquids after notifying MD. Vernie Shanks

## 2011-11-04 NOTE — Progress Notes (Signed)
Decided to hold new prn lopressor dose, pt heart rate 90.  Seems much calmer, less restless, only c/o of pain when moved in bed, mostly of hips and knees.  Odeth Bry L

## 2011-11-04 NOTE — H&P (Addendum)
[name] ZOX:[id number] is a 77 y.o. male,  Outpatient Primary MD for the patient is [name] Sleeper, MD Chief Complaint  Patient presents with  . Code Stroke   11/04/2011      Note this a progress Note entered under H&P in error     Assessment & Plan   1. AMS - due to medication OD In a patient with CVA and L.Hemiperesis - admit on Tele, Neuro checks, reduce pain meds and muscle relaxants, increase ASA to full dose, monitor. Asp precautions. Speech & PT consult. NPO for now as failed bedside swallow, gentle IVF - DC IVF once eating again. D/W Dr [name] no CVA, agrees with EEG ordered, trail of Keppra is symptoms continue.   2. Chr Sytolic CHF post pacemaker - compensated - fluid restriction - home meds B blocker and ACE + fluid restriction. Monitor need for lasix.   3. DM-2 Lantus once eating  + ISS   4. Chr Hip pain with some worsening - stable Hip X ray and recent stable CT, add fentanyl patch low dose, 1 tive IV Toradol on 11-04-11, monitor, PO Meds once cleared by Speech.  5. Hell-foot ulcers - wound care, continue boots.   6. Leukocytosis - initial stable UA and CXR. Monitor clinically, repeat CXR again,mild tachycardia with low grade temp, change ABX for ? HAP, Blood cult now.  Medications: Scheduled Meds:   . aspirin  300 mg Rectal Daily  . cefTRIAXone (ROCEPHIN) IV  1 g Intravenous Q24H  . clopidogrel  75 mg Oral Q breakfast  . fentaNYL  25 mcg Transdermal Q72H  . heparin  5,000 Units Subcutaneous Q8H  . insulin aspart  0-9 Units Subcutaneous Q4H  . ketorolac  15 mg Intravenous Once  . levofloxacin (LEVAQUIN) IV  500 mg Intravenous Q24H  . DISCONTD: aspirin  324 mg Oral Daily  . DISCONTD: baclofen  5 mg Oral TID  . DISCONTD: carvedilol  12.5 mg Oral BID WC  . DISCONTD: cefUROXime  500 mg Oral BID WC  . DISCONTD: docusate sodium  100 mg Oral BID  . DISCONTD: finasteride  5 mg Oral QHS  . DISCONTD: insulin aspart  0-9 Units Subcutaneous  TID WC  . DISCONTD: insulin aspart  1-9 Units Subcutaneous TID WC  . DISCONTD: insulin glargine  5 Units Subcutaneous QHS  . DISCONTD: levofloxacin  500 mg Oral Daily  . DISCONTD: lisinopril  5 mg Oral Daily  . DISCONTD: LORazepam  2 mg Intravenous Once  . DISCONTD: mirtazapine  7.5 mg Oral QHS  . DISCONTD: protein supplement  30 mL Oral BID  . DISCONTD: simvastatin  20 mg Oral QHS  . DISCONTD: spironolactone  25 mg Oral Daily  . DISCONTD: Tamsulosin HCl  0.4 mg Oral QHS  . DISCONTD: zinc sulfate  220 mg Oral QPM   Continuous Infusions:   . sodium chloride     PRN Meds:.albuterol, bisacodyl, hydrALAZINE, iohexol, morphine injection, ondansetron (ZOFRAN) IV, ondansetron (ZOFRAN) IV, ondansetron, DISCONTD: metaxalone, DISCONTD: oxyCODONE-acetaminophen, DISCONTD: senna-docusate   Subjective:   [name] today has, No headache, No chest pain, No abdominal pain - No Nausea, No new weakness tingling or numbness, No Cough - SOB. +ve Bilat hip pain.  Objective:   Vital signs in last 24 hours:  Filed Vitals:   11/04/11 0015 11/04/11 0300 11/04/11 0540 11/04/11 0552  BP: 144/80  214/69 180/80  Pulse:   75   Temp:   100.6 F (38.1 C) 99.8 F (37.7 C)  TempSrc:  Axillary Axillary  Resp:   24   Weight:  73.2 kg (161 lb 6 oz)    SpO2:   99%     Intake/Output from previous day:   Intake/Output Summary (Last 24 hours) at 11/04/11 1043 Last data filed at 11/03/11 2314  Gross per 24 hour  Intake    150 ml  Output      0 ml  Net    150 ml    Exam Awake  Alert, mildly confused, No new F.N deficits, moving all 4 extremities, stable R facial droop and L Hemiperesis [name].AT,[name] Supple Neck,No JVD, No cervical lymphadenopathy appriciated.  Symmetrical Chest wall movement, Good air movement bilaterally, CTAB RRR,No Gallops,Rubs or new Murmurs, No Parasternal Heave +ve B.Sounds, Abd Soft, Non tender, No organomegaly appriciated, No rebound -guarding or rigidity. No Cyanosis,  Clubbing or edema, No new Rash or bruise   Data Review   Radiology Reports  Ct Angio Head W/cm &/or Wo Cm  11/03/2011  *RADIOLOGY REPORT*  Clinical Data:  Question CVA.  The patient is unresponsive.  The patient received morphine the.  At the time of the exam, the patient had become responsive again.  CT ANGIOGRAPHY HEAD AND NECK  Technique:  Multidetector CT imaging of the head and neck was performed using the standard protocol during bolus administration of intravenous contrast.  Multiplanar CT image reconstructions including MIPs were obtained to evaluate the vascular anatomy. Carotid stenosis measurements (when applicable) are obtained utilizing NASCET criteria, using the distal internal carotid diameter as the denominator.  Contrast: 50mL OMNIPAQUE IOHEXOL 350 MG/ML IV SOLN  Comparison:  CT head without contrast 11/03/2011.  CTA NECK  Findings:  A standard three-vessel arch configuration is present. There is marked tortuosity of the innominate artery without significant stenosis.  Both vertebral arteries originate from the subclavian arteries. There is dense calcification at the origin of the nondominant right vertebral artery, suggesting a significant stenosis.  No other focal stenosis is present in the neck.  There are some atherosclerotic calcifications at the dural margin.  Marginal calcifications are present at the proximal left vertebral artery without significant stenosis.  There is a stenosis prior to the artery entering the vertebral canal.  This measures proximally 50%. A second stenosis is present at the level where the artery does anterior the foramen transversarium, also measuring approximately 50%.  The left vertebral artery is then within normal limits above that level.  It is the dominant vessel and to the head.  The right common carotid artery is tortuous, but without significant stenosis.  Atherosclerotic calcifications are present at the right carotid bifurcation.  There is no  significant stenosis relative to the distal vessel.  The left common carotid artery is tortuous proximally, but without significant stenosis.  Calcified and noncalcified plaque is present in the proximal left internal carotid artery without significant stenosis.  The soft tissues of the neck are otherwise unremarkable.  The lung apices are clear.  Degenerative changes are present throughout the cervical spine.   Review of the MIP images confirms the above findings.  IMPRESSION:  1.  Calcification at the origin of the right vertebral artery with a probable high-grade stenosis. 2.  Marginal calcification at the origin of the left vertebral artery without significant proximal stenosis. 3.  Tandem stenoses are present in the proximal left vertebral artery as the vessel enters the foramen transversarium. 4.  Atherosclerotic calcifications and noncalcified plaque at the carotid bifurcations bilaterally without significant stenosis. 5.  Moderate tortuosity of  the innominate and left common carotid arteries.  CTA HEAD  Findings:  The source images demonstrate no acute cortical infarction.  The postcontrast images demonstrate no areas of pathologic enhancement. Moderate periventricular white matter hypoattenuation is compatible small vessel disease.  A remote lacunar infarct is noted in the right cerebellum.  There are lacunar infarcts involving the right thalamus as well, likely remote.  The right cavernous carotid artery is heavily calcified.  The true lumen is narrowed to 2 mm. The distal lumen measures 2.4 mm.  Dense calcifications are noted within the left cavernous carotid artery as well.  The distal lumen is narrowed to 1 mm.  This compares with a more normal distal vessel of 3 mm.  The supraclinoid internal carotid arteries are unremarkable.  The A1 segments are normal bilaterally.  There is focal stenosis in the distal right M1 segment at the level of bifurcation.  There is irregularity in the distal left M1  segment.  A high-grade stenosis is present in the proximal inferior right M1 segment left M1 segment.  There is segmental stenoses in the more distal branches. Irregularities are noted throughout the ACA branches without a significant proximal stenosis or occlusion.  Multiple areas of segmental irregularity are present within the left MCA branch vessels without a significant proximal stenosis or occlusion.  The vertebral basilar junction is within normal limits.  The basilar artery is small and tortuous.  There is a 50% stenosis in the mid basilar segment.  Both posterior cerebral arteries originate from basilar tip.  There is significant segmental irregularity within and PCA branch vessels bilaterally.  The dural sinuses are patent.  No focal enhancement is seen.   Review of the MIP images confirms the above findings.  IMPRESSION:  1.  Moderate small vessel disease. 2.  Focal stenosis of the distal right M1 segment. 3.  Dense atherosclerotic calcifications involving the cavernous carotid arteries bilaterally with left greater than right moderate stenoses. 4.  50% stenosis of the basilar artery. 5.  Atherosclerotic calcifications at the dural margins of the vertebral arteries bilaterally. 6.  Diffuse white matter hypoattenuation, compatible with small vessel disease.  Original Report Authenticated By: Jamesetta Orleans. MATTERN, M.D.   Dg Hip Bilateral W/pelvis  11/04/2011  *RADIOLOGY REPORT*  Clinical Data: Chronic bilateral hip pain and limited range of motion, status post fall.  BILATERAL HIP WITH PELVIS - 4+ VIEW  Comparison: CT of the abdomen and pelvis performed 09/20/2011  Findings: There is no evidence of fracture or dislocation.  Both femoral heads are seated normally within their respective acetabula.  Mild degenerative sclerotic change is seen at both hip joints, without evidence of significant joint space narrowing; there is mild degenerative change at the pubic symphysis.  The sacroiliac joints are  unremarkable in appearance; mild degenerative change is noted along the lower lumbar spine.  The visualized bowel gas pattern is grossly unremarkable in appearance. Diffuse vascular calcifications are seen.  Contrast is noted within the bladder.  IMPRESSION:  1.  No evidence of fracture or dislocation. 2.  Mild degenerative change noted at both hip joints, without significant joint space narrowing; mild degenerative change at the lower lumbar spine. 3.  Diffuse vascular calcifications seen.  Original Report Authenticated By: Tonia Ghent, M.D.   Ct Head Wo Contrast  11/03/2011  *RADIOLOGY REPORT*  Clinical Data: Aphasia.  CT HEAD WITHOUT CONTRAST  Technique:  Contiguous axial images were obtained from the base of the skull through the vertex without contrast.  Comparison: 10/25/2011.  Findings: No intracranial hemorrhage.  Remote right cerebellar infarct and right sub insular infarct. Prominent small vessel disease type changes. No CT evidence of large acute infarct.  Small acute infarct cannot be excluded by CT.  No intracranial mass lesion detected on this unenhanced exam.  Global atrophy without hydrocephalus.  Vascular calcifications.  IMPRESSION: No intracranial hemorrhage or CT evidence of large acute infarct.  Remote infarcts and small vessel disease type changes.  Critical Value/emergent results were called by telephone at the time of interpretation on 11/03/2011  at 2:00 p.m.  to  [facility] who verbally acknowledged these results.  Original Report Authenticated By: Fuller Canada, M.D.   Ct Angio Neck W/cm &/or Wo/cm  11/03/2011  *RADIOLOGY REPORT*  Clinical Data:  Question CVA.  The patient is unresponsive.  The patient received morphine the.  At the time of the exam, the patient had become responsive again.  CT ANGIOGRAPHY HEAD AND NECK  Technique:  Multidetector CT imaging of the head and neck was performed using the standard protocol during bolus administration of intravenous  contrast.  Multiplanar CT image reconstructions including MIPs were obtained to evaluate the vascular anatomy. Carotid stenosis measurements (when applicable) are obtained utilizing NASCET criteria, using the distal internal carotid diameter as the denominator.  Contrast: 50mL OMNIPAQUE IOHEXOL 350 MG/ML IV SOLN  Comparison:  CT head without contrast 11/03/2011.  CTA NECK  Findings:  A standard three-vessel arch configuration is present. There is marked tortuosity of the innominate artery without significant stenosis.  Both vertebral arteries originate from the subclavian arteries. There is dense calcification at the origin of the nondominant right vertebral artery, suggesting a significant stenosis.  No other focal stenosis is present in the neck.  There are some atherosclerotic calcifications at the dural margin.  Marginal calcifications are present at the proximal left vertebral artery without significant stenosis.  There is a stenosis prior to the artery entering the vertebral canal.  This measures proximally 50%. A second stenosis is present at the level where the artery does anterior the foramen transversarium, also measuring approximately 50%.  The left vertebral artery is then within normal limits above that level.  It is the dominant vessel and to the head.  The right common carotid artery is tortuous, but without significant stenosis.  Atherosclerotic calcifications are present at the right carotid bifurcation.  There is no significant stenosis relative to the distal vessel.  The left common carotid artery is tortuous proximally, but without significant stenosis.  Calcified and noncalcified plaque is present in the proximal left internal carotid artery without significant stenosis.  The soft tissues of the neck are otherwise unremarkable.  The lung apices are clear.  Degenerative changes are present throughout the cervical spine.   Review of the MIP images confirms the above findings.  IMPRESSION:  1.   Calcification at the origin of the right vertebral artery with a probable high-grade stenosis. 2.  Marginal calcification at the origin of the left vertebral artery without significant proximal stenosis. 3.  Tandem stenoses are present in the proximal left vertebral artery as the vessel enters the foramen transversarium. 4.  Atherosclerotic calcifications and noncalcified plaque at the carotid bifurcations bilaterally without significant stenosis. 5.  Moderate tortuosity of the innominate and left common carotid arteries.  CTA HEAD  Findings:  The source images demonstrate no acute cortical infarction.  The postcontrast images demonstrate no areas of pathologic enhancement. Moderate periventricular white matter hypoattenuation is compatible small vessel disease.  A remote  lacunar infarct is noted in the right cerebellum.  There are lacunar infarcts involving the right thalamus as well, likely remote.  The right cavernous carotid artery is heavily calcified.  The true lumen is narrowed to 2 mm. The distal lumen measures 2.4 mm.  Dense calcifications are noted within the left cavernous carotid artery as well.  The distal lumen is narrowed to 1 mm.  This compares with a more normal distal vessel of 3 mm.  The supraclinoid internal carotid arteries are unremarkable.  The A1 segments are normal bilaterally.  There is focal stenosis in the distal right M1 segment at the level of bifurcation.  There is irregularity in the distal left M1 segment.  A high-grade stenosis is present in the proximal inferior right M1 segment left M1 segment.  There is segmental stenoses in the more distal branches. Irregularities are noted throughout the ACA branches without a significant proximal stenosis or occlusion.  Multiple areas of segmental irregularity are present within the left MCA branch vessels without a significant proximal stenosis or occlusion.  The vertebral basilar junction is within normal limits.  The basilar artery is small  and tortuous.  There is a 50% stenosis in the mid basilar segment.  Both posterior cerebral arteries originate from basilar tip.  There is significant segmental irregularity within and PCA branch vessels bilaterally.  The dural sinuses are patent.  No focal enhancement is seen.   Review of the MIP images confirms the above findings.  IMPRESSION:  1.  Moderate small vessel disease. 2.  Focal stenosis of the distal right M1 segment. 3.  Dense atherosclerotic calcifications involving the cavernous carotid arteries bilaterally with left greater than right moderate stenoses. 4.  50% stenosis of the basilar artery. 5.  Atherosclerotic calcifications at the dural margins of the vertebral arteries bilaterally. 6.  Diffuse white matter hypoattenuation, compatible with small vessel disease.  Original Report Authenticated By: Jamesetta Orleans. MATTERN, M.D.   Dg Chest Portable 1 View  11/03/2011  *RADIOLOGY REPORT*  Clinical Data: Chest pain.  PORTABLE CHEST - 1 VIEW  Comparison: Chest x-ray 10/25/2011.  Findings: The pacer wires are stable.  Mild stable cardiac enlargement.  Streaky bibasilar atelectasis and possible small left effusion.  No edema or pneumothorax.  IMPRESSION: Low lung volumes with streaky bibasilar atelectasis and possible small left effusion.  Original Report Authenticated By: P. Loralie Champagne, M.D.    Lab Results:    [facility] 11/04/11 0645 11/03/11 2127 11/03/11 1419  NA 139 -- 136  K 4.2 -- 4.9  CL 104 -- 101  CO2 26 -- 28  GLUCOSE 85 -- 133*  BUN 15 -- 18  CREATININE 0.88 0.87 --  CALCIUM 8.8 -- 8.9  MG -- -- --  PHOS -- -- --    Basename 11/03/11 1419  AST 45*  ALT 41  ALKPHOS 136*  BILITOT 0.4  PROT 6.6  ALBUMIN 2.1*   No results found for this basename: LIPASE:2,AMYLASE:2 in the last 72 hours  Basename 11/04/11 0645 11/03/11 2127 11/03/11 1419  WBC 14.4* 15.2* --  NEUTROABS -- -- 11.1*  HGB 9.0* 9.4* --  HCT 28.0* 29.1* --  MCV 87.8 88.4 --  PLT 375 384 --     Basename 11/03/11 1419  CKTOTAL 238*  CKMB 11.0*  CKMBINDEX --  TROPONINI <0.30    Basename 11/03/11 2127  TSH 1.603  T4TOTAL --  T3FREE --  THYROIDAB --

## 2011-11-04 NOTE — Progress Notes (Signed)
Pt admitted to 5502-02 from ED 11/03/11 at 2045.  Placed on tele, running V paced on demand.  Pt is alert to self and responds to name.  States he "doesn't know" where he is at, or what year it is.  Has been yelling when moved in bed.  All four limbs are contracted, able to move hands and feet a small amount.  Follows simple commands.  Skin is dry and intact except a necrotic wound covering the entire bottom of LT heel, no drainage; Mepliex placed over top.  Incontinent of urine and stool.  MRSA was negative.  Had failed bedside swallow eval in ED, so MD on call was notified to assess orders for PO meds and diet.  Order was placed for speech therapy eval tomorrow as a nursing scope of practice protocol.  MD aware.  Will continue to monitor.  Angelique Blonder, RN

## 2011-11-04 NOTE — Progress Notes (Signed)
Subjective: Pt tearful, calling out to his Shaune Pollack, afraid he is dying.  Asked to see his clergyman, Shona Simpson.  Spoke with Mr. Jacqlyn Krauss by phone, plans to visit patient.  He is a Teacher, [name] as a foreign language.  He c/o diffuse body pain, esp. hips and heels.  Is NPO; failed swallow screen.  Nonambulatory at baseline.  Was taking PO meds prior to this hospitalization.  Objective: Vital signs in last 24 hours: Temp:  [98.9 F (37.2 C)-100.6 F (38.1 C)] 99.8 F (37.7 C) (11/10 0552) Pulse Rate:  [63-77] 75  (11/10 0540) Resp:  [17-24] 24  (11/10 0540) BP: (134-214)/(47-88) 180/80 mmHg (11/10 0552) SpO2:  [96 %-99 %] 99 % (11/10 0540) Weight:  [73.2 kg (161 lb 6 oz)] 161 lb 6 oz (73.2 kg) (11/10 0300)  Intake/Output from previous day: 2023-11-18 0701 - 11/10 0700 In: 150 [IV Piggyback:150] Out: -  Intake/Output this shift:   Nutritional status: NPO- failed swallow screen.  Neurologic Exam:Mental Status: Alert, oriented to person and time.  Thinks he is on a cruise ship.  Speech fluent without evidence of aphasia. Able to follow 3 step commands without difficulty. Cranial Nerves: II-Visual fields grossly intact. III/IV/VI-Extraocular movements intact.  Pupils reactive bilaterally. V/VII-Smile asymmetric, with L sided droop. VIII-grossly intact XII-midline tongue extension Motor: bradykinesia, fluid movement UE's.  Keeps LE's in contracted/fetal position.  Pain to minimal passive or active movement of all extremities, esp lowers.  Diffuse atrophy x 4 ext.  Feet in boots.  Grips R>L. Sensory: light touch intact throughout, bilaterally  Lab Results:  Basename 11/04/11 0645 11-18-11 2127 2011/11/18 1419  WBC 14.4* 15.2* --  HGB 9.0* 9.4* --  HCT 28.0* 29.1* --  PLT 375 384 --  NA 139 -- 136  K 4.2 -- 4.9  CL 104 -- 101  CO2 26 -- 28  GLUCOSE 85 -- 133*  BUN 15 -- 18  CREATININE 0.88 0.87 --  CALCIUM 8.8 -- 8.9  LABA1C -- -- --   Lipid Panel LDL 50 10/26/11  Studies/Results:  EEG:  PENDING  11-18-2011 Head CT w/o contrast IMPRESSION:  No intracranial hemorrhage or CT evidence of large acute infarct.  Remote infarcts and small vessel disease type changes.   Original Report Authenticated By: Fuller Canada, M.D.  Nov 18, 2011 CTA Head/Neck IMPRESSION:  1. Moderate small vessel disease.  2. Focal stenosis of the distal right M1 segment.  3. Dense atherosclerotic calcifications involving the cavernous  carotid arteries bilaterally with left greater than right moderate  stenoses.  4. 50% stenosis of the basilar artery.  5. Atherosclerotic calcifications at the dural margins of the  vertebral arteries bilaterally.  6. Diffuse white matter hypoattenuation, compatible with small  vessel disease.  Original Report Authenticated By: Jamesetta Orleans. MATTERN, M.D.  Medications: I have reviewed the patient's current medications. Currently NPO.    Assessment/Plan: 77 years old man who came in likely very lethargic due to morphine. Now alert, speaking fluently.  Suspect pt currently at baseline.  NPO due to failed swallow screen.  May have been due to lethargy.  ST to re-eval today.   No evidence of CVA on CT or CTA. No evidence of a thrombus. The event was not consistent with seizure and has recurred in the past in association with opiates.   Currently c/o pain and thirst.  1) EEG pending.   2) Call with questions. 3) Pain management 4) ST eval. 5) Consider palliative consult.   Marya Fossa PA-C [facility] [phone] 11/04/2011  9:28 AM

## 2011-11-04 NOTE — Progress Notes (Signed)
ANTIBIOTIC CONSULT NOTE - INITIAL  Pharmacy Consult for Vancomycin Indication: rule out pneumonia  No Known Allergies  Patient Measurements: Height: 5\' 10"  (177.8 cm) Weight: 161 lb 6 oz (73.2 kg) IBW/kg (Calculated) : 73    Vital Signs: Temp: 99.8 F (37.7 C) (11/10 0552) Temp src: Axillary (11/10 0552) BP: 180/80 mmHg (11/10 0552) Pulse Rate: 75  (11/10 0540) Intake/Output from previous day: 11/09 0701 - 11/10 0700 In: 150 [IV Piggyback:150] Out: -  Intake/Output from this shift:    Labs:  [facility] 11/04/11 0645 11/03/11 2127 11/03/11 1419  WBC 14.4* 15.2* 14.1*  HGB 9.0* 9.4* 8.9*  PLT 375 384 360  LABCREA -- -- --  CREATININE 0.88 0.87 0.96   Estimated Creatinine Clearance: 66.8 ml/min (by C-G formula based on Cr of 0.88). No results found for this basename: VANCOTROUGH:2,VANCOPEAK:2,VANCORANDOM:2,GENTTROUGH:2,GENTPEAK:2,GENTRANDOM:2,TOBRATROUGH:2,TOBRAPEAK:2,TOBRARND:2,AMIKACINPEAK:2,AMIKACINTROU:2,AMIKACIN:2, in the last 72 hours   Microbiology: Recent Results (from the past 720 hour(s))  URINE CULTURE     Status: Normal   Collection Time   10/19/11  3:32 PM      Component Value Range Status Comment   Specimen Description URINE, RANDOM   Final    Special Requests NONE   Final    Setup Time [id number]   Final    Colony Count NO GROWTH   Final    Culture NO GROWTH   Final    Report Status 10/20/2011 FINAL   Final   URINE CULTURE     Status: Normal   Collection Time   10/25/11  8:46 PM      Component Value Range Status Comment   Specimen Description URINE, CLEAN CATCH   Final    Special Requests NONE   Final    Setup Time [id number]   Final    Colony Count >=100,000 COLONIES/ML   Final    Culture ESCHERICHIA COLI   Final    Report Status 10/27/2011 FINAL   Final    Organism ID, Bacteria ESCHERICHIA COLI   Final   MRSA PCR SCREENING     Status: Normal   Collection Time   10/25/11 11:37 PM      Component Value Range Status Comment   MRSA by PCR  NEGATIVE  NEGATIVE  Final   MRSA PCR SCREENING     Status: Normal   Collection Time   11/03/11 10:32 PM      Component Value Range Status Comment   MRSA by PCR NEGATIVE  NEGATIVE  Final     Medical History: Past Medical History  Diagnosis Date  . Diabetes mellitus   . Hypertension   . CHF (congestive heart failure)   . CAD (coronary artery disease)   . Pacemaker   . TIA (transient ischemic attack)   . Stroke   . Hyperlipidemia   . Spinal stenosis   . DJD (degenerative joint disease)   . Peripheral vascular disease   . Hemorrhoids   . Carotid artery occlusion   . Dysphagia S/P CVA (cerebrovascular accident)     Medications:  Prescriptions prior to admission  Medication Sig Dispense Refill  . aspirin 81 MG chewable tablet Chew 81 mg by mouth daily.       Marland Kitchen atorvastatin (LIPITOR) 20 MG tablet Take 20 mg by mouth at bedtime.       . baclofen (LIORESAL) 10 MG tablet Take 1 tablet (10 mg total) by mouth 3 (three) times daily.  30 each    . carvedilol (COREG) 12.5 MG tablet Take 12.5 mg by mouth  2 (two) times daily with a meal.        . cefUROXime (CEFTIN) 500 MG tablet Take 1 tablet (500 mg total) by mouth 2 (two) times daily with a meal.  8 tablet  0  . clopidogrel (PLAVIX) 75 MG tablet Take 75 mg by mouth daily.       Marland Kitchen docusate sodium 100 MG CAPS Take 100 mg by mouth 2 (two) times daily.  10 capsule    . finasteride (PROSCAR) 5 MG tablet Take 5 mg by mouth at bedtime.       . insulin aspart (NOVOLOG) 100 UNIT/ML injection Inject 1-9 Units into the skin 3 (three) times daily with meals. Per sliding scale      . insulin glargine (LANTUS) 100 UNIT/ML injection Inject 15 Units into the skin at bedtime.        Marland Kitchen levofloxacin (LEVAQUIN) 500 MG tablet Take 500 mg by mouth daily. 7 day course. Started on 10/30/11      . lisinopril (PRINIVIL,ZESTRIL) 5 MG tablet Take 5 mg by mouth daily.        . metaxalone (SKELAXIN) 800 MG tablet Take 800 mg by mouth 3 (three) times daily as needed. For  muscle spasms      . mirtazapine (REMERON) 7.5 MG tablet Take 7.5 mg by mouth at bedtime.       . Multiple Vitamins-Minerals (ICAPS) CAPS Take 1 capsule by mouth daily.       Marland Kitchen oxyCODONE-acetaminophen (PERCOCET) 5-325 MG per tablet Take 2 tablets by mouth every 4 (four) hours as needed. For pain      . protein supplement (PROSOURCE NO CARB) LIQD Take 30 mLs by mouth 2 (two) times daily.        Marland Kitchen spironolactone (ALDACTONE) 25 MG tablet Take 25 mg by mouth daily.        . Tamsulosin HCl (FLOMAX) 0.4 MG CAPS Take 0.4 mg by mouth at bedtime.       Marland Kitchen zinc sulfate 220 MG capsule Take 220 mg by mouth every evening.       . Nutritional Supplements (BOOST GLUCOSE CONTROL) LIQD Take 1 Can by mouth 3 (three) times daily with meals.        . Sennosides (SENOKOT PO) Take 2 tablets by mouth daily as needed. For constipation       Assessment: 77 yo M to begin vancomycin for suspected PNA. Patient has had a fever and WBC are elevated. Zosyn also ordered.  Goal of Therapy:  Vancomycin trough level 15-20 mcg/ml  Plan:  Vancomycin 1 g IV q12h - 1st now. Follow-up renal function and microdata.  Benjamin Perez, Wessley Emert Danielle 11/04/2011,1:30 PM

## 2011-11-04 NOTE — Progress Notes (Signed)
Speech Language Pathology Clinical/Bedside Swallow Evaluation Patient Details  Name: Liahm Grivas MRN: [id number] DOB: Aug 28, 1927 Today's Date: 11/04/2011  Past Medical History:  Past Medical History  Diagnosis Date  . Diabetes mellitus   . Hypertension   . CHF (congestive heart failure)   . CAD (coronary artery disease)   . Pacemaker   . TIA (transient ischemic attack)   . Stroke   . Hyperlipidemia   . Spinal stenosis   . DJD (degenerative joint disease)   . Peripheral vascular disease   . Hemorrhoids   . Carotid artery occlusion   . Dysphagia S/P CVA (cerebrovascular accident)    Past Surgical History:  Past Surgical History  Procedure Date  . Pacemaker placement     Assessment/Recommendations/Treatment Plan Clinical Impression Statement: Demonstrates a mild oral phase dysphagia marked by left facial/lingual/labial weakness/droop with left oral residue and anterior spillage with likely decreased sensation.  Patient's lethargic cognitive state also an impacting factor on tolerance of trials due to pain medications.  Risk for Aspiration: Mild Other Related Risk Factors: Cognitive impairment;Lethargy  Recommendations 1.   Dysphagia 2 (Fine chop) 2.   Thin Liquid Consistency 3.   Medication Administration: Whole meds with puree 4.  Supervision: Staff feed patient 5.  Compensations: Slow rate;Small sips/bites;Check for pocketing;Check for anterior loss, Seated upright 90 degrees Oral Care Recommendations: Oral care BID;Staff/trained caregiver to provide oral care;Oral care before and after PO  Swallow Study Goals  SLP Swallowing Goals Patient will consume recommended diet without observed clinical signs of aspiration with: Minimal assistance Patient will utilize recommended strategies during swallow to increase swallowing safety with: Minimal assistance  Myra Rude, M.S.,CCC-SLP Pager [phone]

## 2011-11-05 LAB — GLUCOSE, CAPILLARY
Glucose-Capillary: 150 mg/dL — ABNORMAL HIGH (ref 70–99)
Glucose-Capillary: 176 mg/dL — ABNORMAL HIGH (ref 70–99)

## 2011-11-05 MED ORDER — SODIUM CHLORIDE 0.9 % IV SOLN
INTRAVENOUS | Status: AC
  Administered 2011-11-05: 12:00:00 via INTRAVENOUS

## 2011-11-05 MED ORDER — CARVEDILOL 3.125 MG PO TABS
3.1250 mg | ORAL_TABLET | Freq: Two times a day (BID) | ORAL | Status: DC
  Administered 2011-11-05 – 2011-11-09 (×9): 3.125 mg via ORAL
  Filled 2011-11-05 (×11): qty 1

## 2011-11-05 MED ORDER — TAMSULOSIN HCL 0.4 MG PO CAPS
0.4000 mg | ORAL_CAPSULE | Freq: Every day | ORAL | Status: DC
  Administered 2011-11-05 – 2011-11-13 (×9): 0.4 mg via ORAL
  Filled 2011-11-05 (×10): qty 1

## 2011-11-05 MED ORDER — SIMVASTATIN 20 MG PO TABS
20.0000 mg | ORAL_TABLET | Freq: Every day | ORAL | Status: DC
  Administered 2011-11-05 – 2011-11-06 (×2): 20 mg via ORAL
  Filled 2011-11-05 (×3): qty 1

## 2011-11-05 MED ORDER — LISINOPRIL 2.5 MG PO TABS
2.5000 mg | ORAL_TABLET | Freq: Every day | ORAL | Status: DC
  Administered 2011-11-06 – 2011-11-14 (×9): 2.5 mg via ORAL
  Filled 2011-11-05 (×10): qty 1

## 2011-11-05 NOTE — Progress Notes (Signed)
[name] RUE:[id number],JYN:[id number] is a 77 y.o. male,  Outpatient Primary MD for the patient is [name], MD Chief Complaint  Patient presents with  . Code Stroke   11/05/2011    Assessment & Plan   1. AMS - due to?  medication OD In a patient with CVA and L.Hemiperesis - admit on Tele, Neuro checks, reduce pain meds and muscle relaxants, increase ASA to full dose, monitor. Asp precautions. Speech & PT consult appreciated.  gentle IVF as BP mildly low on 11-11-12n. D/W Dr Lyman Speller no CVA, agrees with EEG ordered, trail of Keppra is symptoms continue. Overall mentation much better, ? At baseline 11-05-11  2. Chr Sytolic CHF post pacemaker - compensated - fluid restriction - home meds at lowered dose-  B blocker and ACE (with hold paremeters)+ fluid restriction. Monitor need for lasix. (gentle IVF 11-05-11 as BP low)  3. DM-2 Lantus once eating + ISS -    Basename 11/05/11 0750 11/05/11 0410 11/04/11 2359  GLUCAP 150* 156* 118*      4. Chr Hip pain with some worsening - stable Hip X ray and recent stable CT, add fentanyl patch low dose, comforatble now.  5. Hell-foot ulcers - wound care, continue boots.   6. Leukocytosis - initial stable UA and CXR. Monitor clinically, repeat CXR again, mild tachycardia with low grade temps 11-04-11,  ABX for ? HAP, Blood cult pending. Improved.   See all Orders from today for further details     Subjective:   [name] today has, No headache, No chest pain, No abdominal pain - No Nausea, No new weakness tingling or numbness, No Cough - SOB.   Objective:   Vital signs in last 24 hours:  Filed Vitals:   11/04/11 1459 11/04/11 2107 11/05/11 0500 11/05/11 0643  BP: 154/69 114/67 90/55 102/65  Pulse: 92 60 62   Temp: 99.2 F (37.3 C) 99.4 F (37.4 C) 98.3 F (36.8 C)   TempSrc:  Oral Oral   Resp: 23 20 20    Height:      Weight:      SpO2: 95% 100% 92%     Intake/Output from previous day:   Intake/Output  Summary (Last 24 hours) at 11/05/11 0934 Last data filed at 11/05/11 0857  Gross per 24 hour  Intake   1450 ml  Output    200 ml  Net   1250 ml    Exam Awake Alert, Oriented *2, No new F.N deficits, Normal affect [name],[name] Supple Neck,No JVD, No cervical lymphadenopathy appriciated.  Symmetrical Chest wall movement, Good air movement bilaterally, CTAB RRR,No Gallops,Rubs or new Murmurs, No Parasternal Heave +ve B.Sounds, Abd Soft, Non tender, No organomegaly appriciated, No rebound -guarding or rigidity. No Cyanosis, Clubbing or edema, No new Rash or bruise, small L heel old ulcer.   Data Review   Radiology Reports  Ct Angio Head W/cm &/or Wo Cm  11/03/2011  *RADIOLOGY REPORT*  Clinical Data:  Question CVA.  The patient is unresponsive.  The patient received morphine the.  At the time of the exam, the patient had become responsive again.  CT ANGIOGRAPHY HEAD AND NECK  Technique:  Multidetector CT imaging of the head and neck was performed using the standard protocol during bolus administration of intravenous contrast.  Multiplanar CT image reconstructions including MIPs were obtained to evaluate the vascular anatomy. Carotid stenosis measurements (when applicable) are obtained utilizing NASCET criteria, using the distal internal carotid diameter as the denominator.  Contrast: 50mL OMNIPAQUE IOHEXOL  350 MG/ML IV SOLN  Comparison:  CT head without contrast 11/03/2011.  CTA NECK  Findings:  A standard three-vessel arch configuration is present. There is marked tortuosity of the innominate artery without significant stenosis.  Both vertebral arteries originate from the subclavian arteries. There is dense calcification at the origin of the nondominant right vertebral artery, suggesting a significant stenosis.  No other focal stenosis is present in the neck.  There are some atherosclerotic calcifications at the dural margin.  Marginal calcifications are present at the proximal left vertebral  artery without significant stenosis.  There is a stenosis prior to the artery entering the vertebral canal.  This measures proximally 50%. A second stenosis is present at the level where the artery does anterior the foramen transversarium, also measuring approximately 50%.  The left vertebral artery is then within normal limits above that level.  It is the dominant vessel and to the head.  The right common carotid artery is tortuous, but without significant stenosis.  Atherosclerotic calcifications are present at the right carotid bifurcation.  There is no significant stenosis relative to the distal vessel.  The left common carotid artery is tortuous proximally, but without significant stenosis.  Calcified and noncalcified plaque is present in the proximal left internal carotid artery without significant stenosis.  The soft tissues of the neck are otherwise unremarkable.  The lung apices are clear.  Degenerative changes are present throughout the cervical spine.   Review of the MIP images confirms the above findings.  IMPRESSION:  1.  Calcification at the origin of the right vertebral artery with a probable high-grade stenosis. 2.  Marginal calcification at the origin of the left vertebral artery without significant proximal stenosis. 3.  Tandem stenoses are present in the proximal left vertebral artery as the vessel enters the foramen transversarium. 4.  Atherosclerotic calcifications and noncalcified plaque at the carotid bifurcations bilaterally without significant stenosis. 5.  Moderate tortuosity of the innominate and left common carotid arteries.  CTA HEAD  Findings:  The source images demonstrate no acute cortical infarction.  The postcontrast images demonstrate no areas of pathologic enhancement. Moderate periventricular white matter hypoattenuation is compatible small vessel disease.  A remote lacunar infarct is noted in the right cerebellum.  There are lacunar infarcts involving the right thalamus as well,  likely remote.  The right cavernous carotid artery is heavily calcified.  The true lumen is narrowed to 2 mm. The distal lumen measures 2.4 mm.  Dense calcifications are noted within the left cavernous carotid artery as well.  The distal lumen is narrowed to 1 mm.  This compares with a more normal distal vessel of 3 mm.  The supraclinoid internal carotid arteries are unremarkable.  The A1 segments are normal bilaterally.  There is focal stenosis in the distal right M1 segment at the level of bifurcation.  There is irregularity in the distal left M1 segment.  A high-grade stenosis is present in the proximal inferior right M1 segment left M1 segment.  There is segmental stenoses in the more distal branches. Irregularities are noted throughout the ACA branches without a significant proximal stenosis or occlusion.  Multiple areas of segmental irregularity are present within the left MCA branch vessels without a significant proximal stenosis or occlusion.  The vertebral basilar junction is within normal limits.  The basilar artery is small and tortuous.  There is a 50% stenosis in the mid basilar segment.  Both posterior cerebral arteries originate from basilar tip.  There is significant segmental irregularity  within and PCA branch vessels bilaterally.  The dural sinuses are patent.  No focal enhancement is seen.   Review of the MIP images confirms the above findings.  IMPRESSION:  1.  Moderate small vessel disease. 2.  Focal stenosis of the distal right M1 segment. 3.  Dense atherosclerotic calcifications involving the cavernous carotid arteries bilaterally with left greater than right moderate stenoses. 4.  50% stenosis of the basilar artery. 5.  Atherosclerotic calcifications at the dural margins of the vertebral arteries bilaterally. 6.  Diffuse white matter hypoattenuation, compatible with small vessel disease.  Original Report Authenticated By: Jamesetta Orleans. MATTERN, M.D.   Dg Hip Bilateral  W/pelvis  11/04/2011  *RADIOLOGY REPORT*  Clinical Data: Chronic bilateral hip pain and limited range of motion, status post fall.  BILATERAL HIP WITH PELVIS - 4+ VIEW  Comparison: CT of the abdomen and pelvis performed 09/20/2011  Findings: There is no evidence of fracture or dislocation.  Both femoral heads are seated normally within their respective acetabula.  Mild degenerative sclerotic change is seen at both hip joints, without evidence of significant joint space narrowing; there is mild degenerative change at the pubic symphysis.  The sacroiliac joints are unremarkable in appearance; mild degenerative change is noted along the lower lumbar spine.  The visualized bowel gas pattern is grossly unremarkable in appearance. Diffuse vascular calcifications are seen.  Contrast is noted within the bladder.  IMPRESSION:  1.  No evidence of fracture or dislocation. 2.  Mild degenerative change noted at both hip joints, without significant joint space narrowing; mild degenerative change at the lower lumbar spine. 3.  Diffuse vascular calcifications seen.  Original Report Authenticated By: Tonia Ghent, M.D.   Ct Head Wo Contrast  11/03/2011  *RADIOLOGY REPORT*  Clinical Data: Aphasia.  CT HEAD WITHOUT CONTRAST  Technique:  Contiguous axial images were obtained from the base of the skull through the vertex without contrast.  Comparison: 10/25/2011.  Findings: No intracranial hemorrhage.  Remote right cerebellar infarct and right sub insular infarct. Prominent small vessel disease type changes. No CT evidence of large acute infarct.  Small acute infarct cannot be excluded by CT.  No intracranial mass lesion detected on this unenhanced exam.  Global atrophy without hydrocephalus.  Vascular calcifications.  IMPRESSION: No intracranial hemorrhage or CT evidence of large acute infarct.  Remote infarcts and small vessel disease type changes.  Critical Value/emergent results were called by telephone at the time of  interpretation on 11/03/2011  at 2:00 p.m.  to  [facility] who verbally acknowledged these results.  Original Report Authenticated By: Fuller Canada, M.D.   Ct Angio Neck W/cm &/or Wo/cm  11/03/2011  *RADIOLOGY REPORT*  Clinical Data:  Question CVA.  The patient is unresponsive.  The patient received morphine the.  At the time of the exam, the patient had become responsive again.  CT ANGIOGRAPHY HEAD AND NECK  Technique:  Multidetector CT imaging of the head and neck was performed using the standard protocol during bolus administration of intravenous contrast.  Multiplanar CT image reconstructions including MIPs were obtained to evaluate the vascular anatomy. Carotid stenosis measurements (when applicable) are obtained utilizing NASCET criteria, using the distal internal carotid diameter as the denominator.  Contrast: 50mL OMNIPAQUE IOHEXOL 350 MG/ML IV SOLN  Comparison:  CT head without contrast 11/03/2011.  CTA NECK  Findings:  A standard three-vessel arch configuration is present. There is marked tortuosity of the innominate artery without significant stenosis.  Both vertebral arteries originate from the  subclavian arteries. There is dense calcification at the origin of the nondominant right vertebral artery, suggesting a significant stenosis.  No other focal stenosis is present in the neck.  There are some atherosclerotic calcifications at the dural margin.  Marginal calcifications are present at the proximal left vertebral artery without significant stenosis.  There is a stenosis prior to the artery entering the vertebral canal.  This measures proximally 50%. A second stenosis is present at the level where the artery does anterior the foramen transversarium, also measuring approximately 50%.  The left vertebral artery is then within normal limits above that level.  It is the dominant vessel and to the head.  The right common carotid artery is tortuous, but without significant stenosis.   Atherosclerotic calcifications are present at the right carotid bifurcation.  There is no significant stenosis relative to the distal vessel.  The left common carotid artery is tortuous proximally, but without significant stenosis.  Calcified and noncalcified plaque is present in the proximal left internal carotid artery without significant stenosis.  The soft tissues of the neck are otherwise unremarkable.  The lung apices are clear.  Degenerative changes are present throughout the cervical spine.   Review of the MIP images confirms the above findings.  IMPRESSION:  1.  Calcification at the origin of the right vertebral artery with a probable high-grade stenosis. 2.  Marginal calcification at the origin of the left vertebral artery without significant proximal stenosis. 3.  Tandem stenoses are present in the proximal left vertebral artery as the vessel enters the foramen transversarium. 4.  Atherosclerotic calcifications and noncalcified plaque at the carotid bifurcations bilaterally without significant stenosis. 5.  Moderate tortuosity of the innominate and left common carotid arteries.  CTA HEAD  Findings:  The source images demonstrate no acute cortical infarction.  The postcontrast images demonstrate no areas of pathologic enhancement. Moderate periventricular white matter hypoattenuation is compatible small vessel disease.  A remote lacunar infarct is noted in the right cerebellum.  There are lacunar infarcts involving the right thalamus as well, likely remote.  The right cavernous carotid artery is heavily calcified.  The true lumen is narrowed to 2 mm. The distal lumen measures 2.4 mm.  Dense calcifications are noted within the left cavernous carotid artery as well.  The distal lumen is narrowed to 1 mm.  This compares with a more normal distal vessel of 3 mm.  The supraclinoid internal carotid arteries are unremarkable.  The A1 segments are normal bilaterally.  There is focal stenosis in the distal right M1  segment at the level of bifurcation.  There is irregularity in the distal left M1 segment.  A high-grade stenosis is present in the proximal inferior right M1 segment left M1 segment.  There is segmental stenoses in the more distal branches. Irregularities are noted throughout the ACA branches without a significant proximal stenosis or occlusion.  Multiple areas of segmental irregularity are present within the left MCA branch vessels without a significant proximal stenosis or occlusion.  The vertebral basilar junction is within normal limits.  The basilar artery is small and tortuous.  There is a 50% stenosis in the mid basilar segment.  Both posterior cerebral arteries originate from basilar tip.  There is significant segmental irregularity within and PCA branch vessels bilaterally.  The dural sinuses are patent.  No focal enhancement is seen.   Review of the MIP images confirms the above findings.  IMPRESSION:  1.  Moderate small vessel disease. 2.  Focal stenosis of  the distal right M1 segment. 3.  Dense atherosclerotic calcifications involving the cavernous carotid arteries bilaterally with left greater than right moderate stenoses. 4.  50% stenosis of the basilar artery. 5.  Atherosclerotic calcifications at the dural margins of the vertebral arteries bilaterally. 6.  Diffuse white matter hypoattenuation, compatible with small vessel disease.  Original Report Authenticated By: Jamesetta Orleans. MATTERN, M.D.   Dg Chest Portable 1 View  11/04/2011  *RADIOLOGY REPORT*  Clinical Data: Cough, aspiration  PORTABLE CHEST - 1 VIEW  Comparison: Chest radiograph 11/03/2011  Findings: Left-sided pacemaker overlies stable enlarged heart silhouette.  Small bilateral pleural effusions are present.  No pulmonary edema.  No pneumothorax.  IMPRESSION: No significant change.  Original Report Authenticated By: Genevive Bi, M.D.   Dg Chest Portable 1 View  11/03/2011  *RADIOLOGY REPORT*  Clinical Data: Chest pain.   PORTABLE CHEST - 1 VIEW  Comparison: Chest x-ray 10/25/2011.  Findings: The pacer wires are stable.  Mild stable cardiac enlargement.  Streaky bibasilar atelectasis and possible small left effusion.  No edema or pneumothorax.  IMPRESSION: Low lung volumes with streaky bibasilar atelectasis and possible small left effusion.  Original Report Authenticated By: P. Loralie Champagne, M.D.   Lab Results:  Micro Results: Recent Results (from the past 240 hour(s))  MRSA PCR SCREENING     Status: Normal   Collection Time   11/03/11 10:32 PM      Component Value Range Status Comment   MRSA by PCR NEGATIVE  NEGATIVE  Final      Basename 11/04/11 0645 11/03/11 2127 11/03/11 1419  NA 139 -- 136  K 4.2 -- 4.9  CL 104 -- 101  CO2 26 -- 28  GLUCOSE 85 -- 133*  BUN 15 -- 18  CREATININE 0.88 0.87 --  CALCIUM 8.8 -- 8.9  MG -- -- --  PHOS -- -- --    Basename 11/03/11 1419  AST 45*  ALT 41  ALKPHOS 136*  BILITOT 0.4  PROT 6.6  ALBUMIN 2.1*   No results found for this basename: LIPASE:2,AMYLASE:2 in the last 72 hours  Basename 11/04/11 0645 11/03/11 2127 11/03/11 1419  WBC 14.4* 15.2* --  NEUTROABS -- -- 11.1*  HGB 9.0* 9.4* --  HCT 28.0* 29.1* --  MCV 87.8 88.4 --  PLT 375 384 --    Basename 11/03/11 1419  CKTOTAL 238*  CKMB 11.0*  CKMBINDEX --  TROPONINI <0.30    Basename 11/03/11 2127  TSH 1.603  T4TOTAL --  T3FREE --  THYROIDAB --   Medications: Scheduled Meds:   . aspirin  300 mg Rectal Daily  . carvedilol  3.125 mg Oral BID WC  . clopidogrel  75 mg Oral Q breakfast  . dextrose  50 mL Intravenous Once  . fentaNYL  25 mcg Transdermal Q72H  . heparin  5,000 Units Subcutaneous Q8H  . insulin aspart  0-9 Units Subcutaneous Q4H  . ketorolac  15 mg Intravenous Once  . lisinopril  2.5 mg Oral Daily  . piperacillin-tazobactam (ZOSYN)  IV  3.375 g Intravenous Q8H  . simvastatin  20 mg Oral QHS  . Tamsulosin HCl  0.4 mg Oral QHS  . vancomycin  1,000 mg Intravenous Q12H   . DISCONTD: cefTRIAXone (ROCEPHIN) IV  1 g Intravenous Q24H  . DISCONTD: levofloxacin (LEVAQUIN) IV  500 mg Intravenous Q24H   Continuous Infusions:   . DISCONTD: sodium chloride 50 mL/hr at 11/04/11 1100  . DISCONTD: dextrose 5 % and 0.45% NaCl 50 mL/hr at  11/04/11 1331   PRN Meds:.albuterol, bisacodyl, hydrALAZINE, metoprolol, morphine injection, ondansetron (ZOFRAN) IV, ondansetron (ZOFRAN) IV, ondansetron

## 2011-11-05 NOTE — Progress Notes (Signed)
Physical Therapy Evaluation Patient Details Name: [name] MRN: [id number] DOB: [birth year] Today's Date: 11/05/2011  Problem List:  Patient Active Problem List  Diagnoses  . Atherosclerosis of native arteries of the extremities with ulceration  . UTI (lower urinary tract infection)  . CVA, old, hemiparesis  . CHF, left ventricular Ef 40-45 %  . Complete heart block  . Artificial pacemaker  . DM type 2 causing CKD stage 3  . Weakness generalized  . Mental status, decreased    Past Medical History:  Past Medical History  Diagnosis Date  . Diabetes mellitus   . Hypertension   . CHF (congestive heart failure)   . CAD (coronary artery disease)   . Pacemaker   . TIA (transient ischemic attack)   . Stroke   . Hyperlipidemia   . Spinal stenosis   . DJD (degenerative joint disease)   . Peripheral vascular disease   . Hemorrhoids   . Carotid artery occlusion   . Dysphagia S/P CVA (cerebrovascular accident)    Past Surgical History:  Past Surgical History  Procedure Date  . Pacemaker placement     PT Assessment/Plan/Recommendation PT Assessment Clinical Impression Statement: Pt appears to have been non ambulatory and totally dependent for transfers prior to admission.   PT Recommendation/Assessment: All further PT needs can be met in the next venue of care PT Problem List: Decreased strength;Decreased range of motion;Decreased activity tolerance;Decreased balance;Impaired tone;Decreased skin integrity;Pain PT Therapy Diagnosis : Generalized weakness;Hemiplegia non-dominant side PT Recommendation Follow Up Recommendations: Skilled nursing facility Equipment Recommended: Defer to next venue PT Goals  Acute Rehab PT Goals PT Goal Formulation: Patient unable to participate in goal setting  PT Evaluation Precautions/Restrictions  Restrictions Weight Bearing Restrictions: No Prior Functioning  Home Living Lives With: Other (Comment) (SNF) Type of Home: Skilled  Nursing Facility Prior Function Level of Independence: Needs assistance with ADLs;Needs assistance with tranfers (non ambulatory) Able to Take Stairs?: No Driving: No Cognition Cognition Arousal/Alertness: Lethargic Overall Cognitive Status: Appears within functional limits for tasks assessed Orientation Level: Oriented X4 Sensation/Coordination   Extremity Assessment LUE Assessment LUE Assessment: Exceptions to [facility] LUE Strength LUE Overall Strength: Deficits;Due to premorbid status RLE Assessment RLE Assessment: Exceptions to [facility] RLE AROM (degrees) Overall AROM Right Lower Extremity: Deficits;Due to premorbid status RLE Overall AROM Comments: pt unable to extend knees and hips  (bil knee flexor contractures?) RLE Strength RLE Overall Strength: Deficits;Unable to assess;Due to pain;Due to premorbid status LLE Assessment LLE Assessment: Exceptions to [facility] LLE AROM (degrees) Overall AROM Left Lower Extremity: Deficits;Due to decreased strength;Due to pain;Due to premorbid status LLE PROM (degrees) Overall PROM Left Lower Extremity: Deficits;Due to pain;Due to premorbid status LLE Strength LLE Overall Strength: Deficits;Unable to assess;Due to pain;Due to premorbid status Mobility (including Balance) Bed Mobility Bed Mobility: Yes Rolling Right: 1: +1 Total assist Rolling Right Details (indicate cue type and reason): assist to rotate hips and trunk and maintain knee flexion c/o rt hip and leg pain Rolling Left: 1: +1 Total assist Rolling Left Details (indicate cue type and reason): same as rolling to right Right Sidelying to Sit: 1: +2 Total assist;Patient percentage (comment);HOB flat (pt<,20%) Right Sidelying to Sit Details (indicate cue type and reason): tot assist for bil LE and trunk.  pt attempting to push on rail with Lt UE but unable to maintain contanct on rail secondary to Lt hemiplegia.  Sit to Supine - Right: 1: +2 Total assist Sit to Supine - Right Details (indicate  cue type and  reason): total assist for aspects or transition.   Transfers Transfers: Yes Sit to Stand: 1: +2 Total assist;From elevated surface;With upper extremity assist;From bed Sit to Stand Details (indicate cue type and reason): unable to achieve full standing secondary to weakness.  Pt unable to Use Lt UE to assist.  Unable to assist use of Lt LE secondary failure to fully extend bil knees to stand Ambulation/Gait Ambulation/Gait: No Wheelchair Mobility Wheelchair Mobility: No  Posture/Postural Control Posture/Postural Control: Postural limitations Postural Limitations: strong posterior lean in sitting.  unable to correct with out max assist.   Balance Balance Assessed: Yes Static Sitting Balance Static Sitting - Balance Support: Bilateral upper extremity supported;Feet supported Static Sitting - Level of Assistance: 1: +1 Total assist Static Sitting - Comment/# of Minutes: unable to maintain sitting without tot asssit.   Exercise    End of Session PT - End of Session Equipment Utilized During Treatment: Gait belt Activity Tolerance: Patient limited by fatigue;Patient limited by pain;Treatment limited secondary to medical complications (Comment);Other (comment) (pt had episode of tachy cardia during session.  HR 191) Patient left: in bed;with call bell in reach;Other (comment) (RN with patient) Nurse Communication: Mobility status for transfers;Need for lift equipment;Other (comment) (pressure ulcer on Lt heel.) General Behavior During Session: Lethargic Cognition: WFL for tasks performed [name] L. Desteni Piscopo DPT [phone] 11/05/2011, 2:54 PM

## 2011-11-06 ENCOUNTER — Inpatient Hospital Stay (HOSPITAL_COMMUNITY): Payer: Medicare Other

## 2011-11-06 DIAGNOSIS — R569 Unspecified convulsions: Secondary | ICD-10-CM

## 2011-11-06 LAB — GLUCOSE, CAPILLARY

## 2011-11-06 MED ORDER — MORPHINE SULFATE 2 MG/ML IJ SOLN
INTRAMUSCULAR | Status: AC
  Administered 2011-11-06: 1 mg via INTRAVENOUS
  Filled 2011-11-06: qty 1

## 2011-11-06 MED ORDER — FENTANYL 50 MCG/HR TD PT72
50.0000 ug | MEDICATED_PATCH | TRANSDERMAL | Status: DC
  Administered 2011-11-06 – 2011-11-09 (×2): 50 ug via TRANSDERMAL
  Filled 2011-11-06 (×2): qty 1

## 2011-11-06 MED ORDER — GABAPENTIN 600 MG PO TABS
300.0000 mg | ORAL_TABLET | Freq: Three times a day (TID) | ORAL | Status: DC
  Filled 2011-11-06 (×3): qty 0.5

## 2011-11-06 MED ORDER — MORPHINE SULFATE 2 MG/ML IJ SOLN
1.0000 mg | Freq: Once | INTRAMUSCULAR | Status: AC
  Administered 2011-11-06: 1 mg via INTRAVENOUS

## 2011-11-06 MED ORDER — GABAPENTIN 300 MG PO CAPS
300.0000 mg | ORAL_CAPSULE | Freq: Three times a day (TID) | ORAL | Status: DC
  Administered 2011-11-06 – 2011-11-14 (×25): 300 mg via ORAL
  Filled 2011-11-06 (×27): qty 1

## 2011-11-06 NOTE — Progress Notes (Signed)
CSW placed assessment and FL2 in shadow chart. MD please sign FL2. Patient is from [facility]) and can return at dc. CSW will continue to follow.

## 2011-11-06 NOTE — Progress Notes (Signed)
ANTIBIOTIC CONSULT NOTE - FOLLOW UP  Pharmacy Consult for  Vancomycin/Zosyn Indication: rule out pneumonia  No Known Allergies  Patient Measurements: Height: 5\' 10"  (177.8 cm) Weight: 162 lb 0.6 oz (73.5 kg) IBW/kg (Calculated) : 73  Adjusted Body Weight: NA  Vital Signs: Temp: 98.5 F (36.9 C) (11/12 0512) Temp src: Oral (11/12 0512) BP: 181/46 mmHg (11/12 0512) Pulse Rate: 50  (11/12 0512) Intake/Output from previous day: 11/11 0701 - 11/12 0700 In: 2406.3 [P.O.:480; I.V.:1376.3; IV Piggyback:550] Out: 400 [Urine:400] Intake/Output from this shift:    Labs:  [facility] 11/04/11 0645 11/03/11 2127 11/03/11 1419  WBC 14.4* 15.2* 14.1*  HGB 9.0* 9.4* 8.9*  PLT 375 384 360  LABCREA -- -- --  CREATININE 0.88 0.87 0.96   Estimated Creatinine Clearance: 66.8 ml/min (by C-G formula based on Cr of 0.88). No results found for this basename: VANCOTROUGH:2,VANCOPEAK:2,VANCORANDOM:2,GENTTROUGH:2,GENTPEAK:2,GENTRANDOM:2,TOBRATROUGH:2,TOBRAPEAK:2,TOBRARND:2,AMIKACINPEAK:2,AMIKACINTROU:2,AMIKACIN:2, in the last 72 hours   Microbiology: Recent Results (from the past 720 hour(s))  URINE CULTURE     Status: Normal   Collection Time   10/19/11  3:32 PM      Component Value Range Status Comment   Specimen Description URINE, RANDOM   Final    Special Requests NONE   Final    Setup Time [id number]   Final    Colony Count NO GROWTH   Final    Culture NO GROWTH   Final    Report Status 10/20/2011 FINAL   Final   URINE CULTURE     Status: Normal   Collection Time   10/25/11  8:46 PM      Component Value Range Status Comment   Specimen Description URINE, CLEAN CATCH   Final    Special Requests NONE   Final    Setup Time [id number]   Final    Colony Count >=100,000 COLONIES/ML   Final    Culture ESCHERICHIA COLI   Final    Report Status 10/27/2011 FINAL   Final    Organism ID, Bacteria ESCHERICHIA COLI   Final   MRSA PCR SCREENING     Status: Normal   Collection Time   10/25/11  11:37 PM      Component Value Range Status Comment   MRSA by PCR NEGATIVE  NEGATIVE  Final   MRSA PCR SCREENING     Status: Normal   Collection Time   11/03/11 10:32 PM      Component Value Range Status Comment   MRSA by PCR NEGATIVE  NEGATIVE  Final   CULTURE, BLOOD (ROUTINE X 2)     Status: Normal (Preliminary result)   Collection Time   11/04/11  1:46 PM      Component Value Range Status Comment   Specimen Description BLOOD ARM RIGHT   Final    Special Requests BOTTLES DRAWN AEROBIC AND ANAEROBIC 10CC EACH   Final    Setup Time [id number]   Final    Culture     Final    Value:        BLOOD CULTURE RECEIVED NO GROWTH TO DATE CULTURE WILL BE HELD FOR 5 DAYS BEFORE ISSUING A FINAL NEGATIVE REPORT   Report Status PENDING   Incomplete   CULTURE, BLOOD (ROUTINE X 2)     Status: Normal (Preliminary result)   Collection Time   11/04/11  2:00 PM      Component Value Range Status Comment   Specimen Description BLOOD HAND RIGHT   Final    Special Requests BOTTLES DRAWN AEROBIC  ONLY 10CC BLUE   Final    Setup Time [id number]   Final    Culture     Final    Value:        BLOOD CULTURE RECEIVED NO GROWTH TO DATE CULTURE WILL BE HELD FOR 5 DAYS BEFORE ISSUING A FINAL NEGATIVE REPORT   Report Status PENDING   Incomplete     Anti-infectives     Start     Dose/Rate Route Frequency Ordered Stop   11/04/11 1400  piperacillin-tazobactam (ZOSYN) IVPB 3.375 g       3.375 g 12.5 mL/hr over 240 Minutes Intravenous 3 times per day 11/04/11 1313     11/04/11 1400   vancomycin (VANCOCIN) IVPB 1000 mg/200 mL premix        1,000 mg 200 mL/hr over 60 Minutes Intravenous Every 12 hours 11/04/11 1335     11/04/11 1000   levofloxacin (LEVAQUIN) tablet 500 mg  Status:  Discontinued        500 mg Oral Daily 11/03/11 2101 11/03/11 2128   11/04/11 0800   cefUROXime (CEFTIN) tablet 500 mg  Status:  Discontinued        500 mg Oral 2 times daily with meals 11/03/11 2101 11/03/11 2125   11/03/11 2230    cefTRIAXone (ROCEPHIN) 1 g in dextrose 5 % 50 mL IVPB  Status:  Discontinued        1 g 100 mL/hr over 30 Minutes Intravenous Every 24 hours 11/03/11 2125 11/04/11 1053   11/03/11 2230   levofloxacin (LEVAQUIN) IVPB 500 mg  Status:  Discontinued        500 mg 100 mL/hr over 60 Minutes Intravenous Every 24 hours 11/03/11 2128 11/04/11 1053          Assessment: Day #3 of antibiotic therapy with Vancomycin/Zosyn.  All new culture data has been negative thus far.  He seems to be tolerating antibiotic therapy without noted complications.  Renal function seems stable although he has no new labs today to evaluate Creatinine.  Goal of Therapy:  Vancomycin trough level 15-20 mcg/ml  Plan:  Expected duration 5 days with resolution of temperature and/or normalization of WBC Will check a s/s Vancomycin level tomorrow if the Vancomycin is expected to continue.  Nadara Mustard Freeborn 11/06/2011,8:48 AM

## 2011-11-06 NOTE — Progress Notes (Signed)
Utilization Review Completed.  [name]  11/06/2011 

## 2011-11-06 NOTE — Progress Notes (Addendum)
[name] [id number] is a 77 y.o. male,  Outpatient Primary MD for the patient is [name], MD Chief Complaint  Patient presents with  . Code Stroke   11/06/2011    Assessment & Plan   1. AMS - due to?  medication OD In a patient with CVA and L.Hemiperesis - admit on Tele, Neuro checks, reduced PO pain meds and muscle relaxants,DC ASA (rectal) as taking PO meds now and on Plavix, continue Asp precautions. Speech & PT consult appreciated. Noted  Dr Durward Mallard note, no CVA, agrees with EEG ordered, trail of Keppra is symptoms continue. Overall mentation much better, ? At baseline 11-05-11, 11-06-11  2. Chr Sytolic CHF post pacemaker - compensated - fluid restriction - home meds at lowered dose-  B blocker and ACE (with hold paremeters)+ fluid restriction. Monitor need for lasix.   3. DM-2 - stable on ISS - Lantus if needed   [facility] 11/06/11 0802 11/06/11 0447 11/05/11 2355  GLUCAP 130* 156* 122*     4. Chr Hip pain with some worsening - pt today says"my legs are on fire" Chr  stable Hip X ray and recent stable CT, increase fentanyl patch, added Neurontin as this could be Neuropathic, W care for L.Heel continue no obvious cellulits around, no discharge.  5. Chr L. Hell -foot ulcers - wound care, continue boots. Wound looks dry - no obvious cellulits, will get an X ray,ABI, Ortho-Dr Lajoyce Corners to eval.  6. Leukocytosis - initial stable UA and CXR. Monitor clinically, repeat CXR again, mild tachycardia with low grade temps 11-04-11,  ABX for ? HAP, Blood cult monitor. Clincally stable. ? Due to #5.  DVT prophylaxis - Heparin  Lab Results  Component Value Date   PLT 375 11/04/2011    See all Orders from today for further details     Subjective:   [name] today has, No headache, No chest pain, No abdominal pain - No Nausea, No new weakness tingling or numbness, No Cough - SOB.   Objective:   Vital signs in last 24 hours:  Filed Vitals:   11/05/11 1410 11/05/11 1450 11/05/11 2129 11/06/11 0512  BP: 154/92 155/90 186/51 181/46  Pulse: 92 70 59 50  Temp:  98.2 F (36.8 C) 98.4 F (36.9 C) 98.5 F (36.9 C)  TempSrc:   Oral Oral  Resp:  24 20 18   Height:      Weight:    73.5 kg (162 lb 0.6 oz)  SpO2:  95% 99% 94%    Intake/Output from previous day:   Intake/Output Summary (Last 24 hours) at 11/06/11 1031 Last data filed at 11/06/11 0913  Gross per 24 hour  Intake 2406.25 ml  Output    400 ml  Net 2006.25 ml    Exam Awake Alert, Oriented *2, No new F.N deficits, Normal affect [place].AT,[name] Supple Neck,No JVD, No cervical lymphadenopathy appriciated.  Symmetrical Chest wall movement, Good air movement bilaterally, CTAB RRR,No Gallops,Rubs or new Murmurs, No Parasternal Heave +ve B.Sounds, Abd Soft, Non tender, No organomegaly appriciated, No rebound -guarding or rigidity. No Cyanosis, Clubbing or edema, No new Rash or bruise, small L heel old ulcer.   Data Review   Radiology Reports  Dg Chest Portable 1 View  11/04/2011  *RADIOLOGY REPORT*  Clinical Data: Cough, aspiration  PORTABLE CHEST - 1 VIEW  Comparison: Chest radiograph 11/03/2011  Findings: Left-sided pacemaker overlies stable enlarged heart silhouette.  Small bilateral pleural effusions are present.  No pulmonary edema.  No pneumothorax.  IMPRESSION: No significant change.  Original Report Authenticated By: Genevive Bi, M.D.   Lab Results:  Micro Results: Recent Results (from the past 240 hour(s))  MRSA PCR SCREENING     Status: Normal   Collection Time   11/03/11 10:32 PM      Component Value Range Status Comment   MRSA by PCR NEGATIVE  NEGATIVE  Final   CULTURE, BLOOD (ROUTINE X 2)     Status: Normal (Preliminary result)   Collection Time   11/04/11  1:46 PM      Component Value Range Status Comment   Specimen Description BLOOD ARM RIGHT   Final    Special Requests BOTTLES DRAWN AEROBIC AND ANAEROBIC 10CC EACH   Final    Setup Time  [id number]   Final    Culture     Final    Value:        BLOOD CULTURE RECEIVED NO GROWTH TO DATE CULTURE WILL BE HELD FOR 5 DAYS BEFORE ISSUING A FINAL NEGATIVE REPORT   Report Status PENDING   Incomplete   CULTURE, BLOOD (ROUTINE X 2)     Status: Normal (Preliminary result)   Collection Time   11/04/11  2:00 PM      Component Value Range Status Comment   Specimen Description BLOOD HAND RIGHT   Final    Special Requests BOTTLES DRAWN AEROBIC ONLY 10CC BLUE   Final    Setup Time [id number]   Final    Culture     Final    Value:        BLOOD CULTURE RECEIVED NO GROWTH TO DATE CULTURE WILL BE HELD FOR 5 DAYS BEFORE ISSUING A FINAL NEGATIVE REPORT   Report Status PENDING   Incomplete      Basename 11/04/11 0645 11/03/11 2127 11/03/11 1419  NA 139 -- 136  K 4.2 -- 4.9  CL 104 -- 101  CO2 26 -- 28  GLUCOSE 85 -- 133*  BUN 15 -- 18  CREATININE 0.88 0.87 --  CALCIUM 8.8 -- 8.9  MG -- -- --  PHOS -- -- --    Basename 11/03/11 1419  AST 45*  ALT 41  ALKPHOS 136*  BILITOT 0.4  PROT 6.6  ALBUMIN 2.1*   No results found for this basename: LIPASE:2,AMYLASE:2 in the last 72 hours  Basename 11/04/11 0645 11/03/11 2127 11/03/11 1419  WBC 14.4* 15.2* --  NEUTROABS -- -- 11.1*  HGB 9.0* 9.4* --  HCT 28.0* 29.1* --  MCV 87.8 88.4 --  PLT 375 384 --    Basename 11/03/11 1419  CKTOTAL 238*  CKMB 11.0*  CKMBINDEX --  TROPONINI <0.30    Basename 11/03/11 2127  TSH 1.603  T4TOTAL --  T3FREE --  THYROIDAB --   Medications: Scheduled Meds:    . carvedilol  3.125 mg Oral BID WC  . clopidogrel  75 mg Oral Q breakfast  . fentaNYL  50 mcg Transdermal Q72H  . gabapentin  300 mg Oral TID  . heparin  5,000 Units Subcutaneous Q8H  . insulin aspart  0-9 Units Subcutaneous Q4H  . lisinopril  2.5 mg Oral Daily  .  morphine injection  1 mg Intravenous Once  . piperacillin-tazobactam (ZOSYN)  IV  3.375 g Intravenous Q8H  . simvastatin  20 mg Oral QHS  . Tamsulosin HCl  0.4 mg  Oral QHS  . vancomycin  1,000 mg Intravenous Q12H  . DISCONTD: aspirin  300 mg Rectal Daily  . DISCONTD: fentaNYL  25 mcg Transdermal Q72H  . DISCONTD: gabapentin  300 mg Oral TID   Continuous Infusions:    . sodium chloride 75 mL/hr at 11/05/11 1200   PRN Meds:.albuterol, bisacodyl, metoprolol, morphine injection, ondansetron (ZOFRAN) IV, ondansetron

## 2011-11-06 NOTE — Procedures (Signed)
EEG NUMBER:  [id number].  This routine EEG was requested in this 77 year old man who was admitted with altered mental status due to possible medication overdose with a history of an ischemic stroke and a left hemiparesis.  MEDICATIONS:  Medications include gabapentin.  DESCRIPTION OF PROCEDURE:  The EEG was done with the patient drowsy. Drowsiness was characterized by mixed frequency activities  in the alpha and theta ranges.  As the patient became more drowsy, there was a greater amount of low-amplitude poorly organized delta activity but still a predominance of theta and alpha activity.  Low amplitude vertex sharp waves were noted.  The patient did not clearly enter stage 2 sleep, as there were no definite sleep spindles seen.  No normal activities of wakefulness were seen.  Photic stimulation did not produce a driving response.  CLINICAL INTERPRETATION:  This routine EEG done with the patient, drowsy is normal.  More information may be provided by a study during wakefulness, if clinically indicated.          ______________________________ Denton Meek, MD    [id number] D:  11/06/2011 22:24:12  T:  11/06/2011 23:09:50  Job #:  [id number]

## 2011-11-06 NOTE — Progress Notes (Signed)
Speech Pathology: Dysphagia Treatment Note  Patient was observed with : Mechanical Soft / Ground and Thin liquids.  Patient did not exhibit any overt s/s aspiration when drinking thin liquids with straw. Oral residuals of mechanical soft solids noted on right side of oral cavity.  Lung Sounds:  98% SpO2 Temperature: 98.2  Patient required: minimal verbal and demonstration cues cues to consistently follow precautions/strategies  Other:  Recommendations:  Continue with current diet  Pain:   10 /10 "my left heal" Intervention Required:   Yes MD entered room while SLP was present and was informed of pain symptoms.  Goals: Progressing towards goals.  Angela Nevin, MA, CCC-SLP Speech Therapy [facility]

## 2011-11-06 NOTE — Consult Note (Signed)
Subjective: Patient is not feeling well this morning and complains of not being able to sleep the whole night.   Objective: Vital signs in last 24 hours: Temp:  [98.2 F (36.8 C)-98.5 F (36.9 C)] 98.5 F (36.9 C) (11/12 0512) Pulse Rate:  [48-92] 50  (11/12 0512) Resp:  [18-24] 18  (11/12 0512) BP: (146-186)/(46-92) 181/46 mmHg (11/12 0512) SpO2:  [94 %-99 %] 94 % (11/12 0512) FiO2 (%):  [95 %] 95 % (11/11 1450) Weight:  [73.5 kg (162 lb 0.6 oz)] 162 lb 0.6 oz (73.5 kg) (11/12 0512)  Intake/Output from previous day: 11/11 0701 - 11/12 0700 In: 2406.3 [P.O.:480; I.V.:1376.3; IV Piggyback:550] Out: 400 [Urine:400] Intake/Output this shift: Total I/O In: 120 [P.O.:120] Out: -  Nutritional status: Dysphagia  GENERAL: He is awake, alert, oriented to self, knows he is in the  hospital but not the name of the hospital. Cannot tell me the date. He  has no expressive or receptive aphasia. He was able to follow some  simple commands, but inattentive and drowsy.  HEENT: No facial droop. Extraocular movements are intact. Pupils are  equal, round, and reactive to light and accommodation. No nystagmus.  Visual fields are full. He was mildly dysarthric.  MOTOR: He had no drift. He had no weakness in his upper extremities  and right lower extremity. Left lower extremity appears to be somewhat  twisted, and he was about 2/5 in proximal muscles in that due to pain  limitation distally. He was 5/5 in the right lower extremity.  COORDINATION: Finger-to-nose was intact bilaterally.  REFLEXES: Were 1+ in upper extremities, 0 at the knees and ankles.  Plantars are mute. I could not assess his gait due to the patient's hip  problem.  Lab Results:  [facility] 11/04/11 0645 11/03/11 2127 11/03/11 1419  WBC 14.4* 15.2* --  HGB 9.0* 9.4* --  HCT 28.0* 29.1* --  PLT 375 384 --  NA 139 -- 136  K 4.2 -- 4.9  CL 104 -- 101  CO2 26 -- 28  GLUCOSE 85 -- 133*  BUN 15 -- 18  CREATININE 0.88 0.87 --   CALCIUM 8.8 -- 8.9  LABA1C -- -- --   Studies/Results: Dg Chest Portable 1 View  11/04/2011  *RADIOLOGY REPORT*  Clinical Data: Cough, aspiration  PORTABLE CHEST - 1 VIEW  Comparison: Chest radiograph 11/03/2011  Findings: Left-sided pacemaker overlies stable enlarged heart silhouette.  Small bilateral pleural effusions are present.  No pulmonary edema.  No pneumothorax.  IMPRESSION: No significant change.  Original Report Authenticated By: Genevive Bi, M.D.    Medications: I have reviewed the patient's current medications.  Assessment/Plan: 77 years old man who came in with opioid-induced sedation, but has also had multiple CT and CTA's of head with normal work-up. He cannot have an MRI, so it's impossible to know with certainty whether events of slurred speech and hemiparesis in the past where vascular or seizure related. We will obtain an EEG today, but even if no epileptiform activity or sharps or spikes, I think it's reasonable to do a trial of AED's. Specifically, Keppra 500 mg PO bid. His vascular risk factors including blood glucose need to be managed. I am not sure why ASA is pr. If he needs to be on ASA and Plavix due to CAD, then ASA should be PO. IF not, then either one is fine. Patient does not complain of burning pain in feet, so I am not sure what neurontin is used for. Unless compelling evidence of  neuropathic pain, I would stop it. Call with questions.    LOS: 3 days   Murphy Bundick

## 2011-11-07 LAB — GLUCOSE, CAPILLARY
Glucose-Capillary: 111 mg/dL — ABNORMAL HIGH (ref 70–99)
Glucose-Capillary: 155 mg/dL — ABNORMAL HIGH (ref 70–99)
Glucose-Capillary: 156 mg/dL — ABNORMAL HIGH (ref 70–99)
Glucose-Capillary: 180 mg/dL — ABNORMAL HIGH (ref 70–99)

## 2011-11-07 LAB — BASIC METABOLIC PANEL
BUN: 14 mg/dL (ref 6–23)
Chloride: 105 mEq/L (ref 96–112)
Creatinine, Ser: 1.04 mg/dL (ref 0.50–1.35)
GFR calc Af Amer: 75 mL/min — ABNORMAL LOW (ref >90)
GFR calc non Af Amer: 65 mL/min — ABNORMAL LOW (ref >90)
Potassium: 4 mEq/L (ref 3.5–5.1)

## 2011-11-07 LAB — CBC
HCT: 26 % — ABNORMAL LOW (ref 39.0–52.0)
MCHC: 32.3 g/dL (ref 30.0–36.0)
Platelets: 338 10*3/uL (ref 150–400)
RDW: 14.3 % (ref 11.5–15.5)
WBC: 9.1 10*3/uL (ref 4.0–10.5)

## 2011-11-07 LAB — MAGNESIUM: Magnesium: 2.1 mg/dL (ref 1.5–2.5)

## 2011-11-07 MED ORDER — ENSURE CLINICAL ST REVIGOR PO LIQD
237.0000 mL | Freq: Three times a day (TID) | ORAL | Status: DC
  Administered 2011-11-07 – 2011-11-12 (×15): 237 mL via ORAL
  Administered 2011-11-13: 12:00:00 via ORAL
  Administered 2011-11-13: 237 mL via ORAL
  Administered 2011-11-13 – 2011-11-14 (×3): via ORAL

## 2011-11-07 MED ORDER — COLLAGENASE 250 UNIT/GM EX OINT
TOPICAL_OINTMENT | Freq: Every day | CUTANEOUS | Status: DC
  Administered 2011-11-07 – 2011-11-10 (×4): via TOPICAL
  Administered 2011-11-11: 1 via TOPICAL
  Administered 2011-11-12 – 2011-11-14 (×3): via TOPICAL
  Filled 2011-11-07: qty 30

## 2011-11-07 MED ORDER — LEVETIRACETAM 500 MG PO TABS
500.0000 mg | ORAL_TABLET | Freq: Two times a day (BID) | ORAL | Status: DC
  Administered 2011-11-07 – 2011-11-14 (×15): 500 mg via ORAL
  Filled 2011-11-07 (×16): qty 1

## 2011-11-07 MED ORDER — DOXYCYCLINE HYCLATE 100 MG PO TABS
100.0000 mg | ORAL_TABLET | Freq: Two times a day (BID) | ORAL | Status: DC
  Administered 2011-11-07 – 2011-11-09 (×5): 100 mg via ORAL
  Filled 2011-11-07 (×6): qty 1

## 2011-11-07 NOTE — Progress Notes (Addendum)
[name] [id number] is a 77 y.o. male,  Outpatient Primary MD for the patient is [name], MD Chief Complaint  Patient presents with  . Code Stroke   11/07/2011    Assessment & Plan   1. Admitted for AMS - due to?  medication OD In a patient with CVA and L.Hemiperesis , ? Underlying seizure activity - stable Neuro checks, reduced PO pain meds and muscle relaxants,continue Plavix, continue Asp precautions. Speech & PT consult appreciated. Per Neuro no CVA, Neuro agrees stable EEG  , but therapeutic trail  of Keppra per discussion with Dr Irine Seal 11-07-11,  Overall mentation much better, ? At baseline 11-05-11, 11-06-11, 11-07-11  2. Chr Sytolic CHF post pacemaker - compensated - fluid restriction - home meds at lowered doses-  B blocker and ACE (with hold paremeters)+ fluid restriction. Monitor need for lasix.   3. DM-2 - stable on ISS - Lantus if needed   [facility] 11/07/11 0801 11/07/11 0403 11/07/11 0029  GLUCAP 120* 129* 111*     4. Chr Hip pain with some worsening  - pt today says"my legs are on fire" Chr  stable Hip X ray and recent stable CT, increase fentanyl patch, added Neurontin as this could be Neuropathic, W care for L.Heel continue no obvious cellulits around, no discharge. Pain improved 11-07-11.  5. Chr L. Hell -foot ulcers - wound care, continue boots. Wound looks dry - no obvious cellulits, will get an X ray,ABI, Ortho-Dr Lajoyce Corners evaluated no signs of Osteo, X ray stable, await ABI. ? Pain due to combination of Neuropathy + Ischemia.  6. Leukocytosis - initial stable UA and CXR. Monitor clinically, repeat CXR again stable, mild tachycardia with low grade temps 11-04-11, Blood Cult contaminant D/W Dr Luciana Axe 11-07-11, DC Vanco, Po Doxy 5 days for Mild URI. Much improved now.  DVT prophylaxis - Heparin  Lab Results  Component Value Date   PLT 338 11/07/2011    See all Orders from today for further details     Subjective:   [name] today has, No headache, No chest pain, No abdominal pain - No Nausea, No new weakness tingling or numbness, No Cough - SOB.   Objective:   Vital signs in last 24 hours:  Filed Vitals:   11/06/11 1446 11/06/11 2100 11/07/11 0528 11/07/11 1016  BP: 178/66 109/57 179/67 176/67  Pulse: 59 54 54 54  Temp: 98.2 F (36.8 C) 98.9 F (37.2 C) 97.9 F (36.6 C)   TempSrc:  Oral Oral   Resp: 19 17 18    Height:      Weight:   73.5 kg (162 lb 0.6 oz)   SpO2: 98% 100% 100%     Intake/Output from previous day:   Intake/Output Summary (Last 24 hours) at 11/07/11 1122 Last data filed at 11/07/11 0526  Gross per 24 hour  Intake    910 ml  Output    400 ml  Net    510 ml    Exam Awake Alert, Oriented *2, No new F.N deficits, Normal affect [place].AT,[name] Supple Neck,No JVD, No cervical lymphadenopathy appriciated.  Symmetrical Chest wall movement, Good air movement bilaterally, CTAB RRR,No Gallops,Rubs or new Murmurs, No Parasternal Heave +ve B.Sounds, Abd Soft, Non tender, No organomegaly appriciated, No rebound -guarding or rigidity. No Cyanosis, Clubbing or edema, No new Rash or bruise, small L heel old ulcer unstagable    Data Review   Radiology Reports  Dg Foot 2 Views Left  11/06/2011  *RADIOLOGY REPORT*  Clinical Data: Heel wound  LEFT FOOT - 2 VIEW  Comparison: 09/25/2011  Findings: Negative for fracture.  Negative for osteomyelitis of the calcaneus.  Spurring of the calcaneus is present.  There is advanced arterial calcification.  IMPRESSION: Negative for fracture or osteomyelitis of the calcaneus.  Original Report Authenticated By: Camelia Phenes, M.D.   Lab Results:  Micro Results: Recent Results (from the past 240 hour(s))  MRSA PCR SCREENING     Status: Normal   Collection Time   11/03/11 10:32 PM      Component Value Range Status Comment   MRSA by PCR NEGATIVE  NEGATIVE  Final   CULTURE, BLOOD (ROUTINE X 2)     Status: Normal (Preliminary result)    Collection Time   11/04/11  1:46 PM      Component Value Range Status Comment   Specimen Description BLOOD ARM RIGHT   Final    Special Requests BOTTLES DRAWN AEROBIC AND ANAEROBIC 10CC EACH   Final    Setup Time [id number]   Final    Culture     Final    Value:        BLOOD CULTURE RECEIVED NO GROWTH TO DATE CULTURE WILL BE HELD FOR 5 DAYS BEFORE ISSUING A FINAL NEGATIVE REPORT   Report Status PENDING   Incomplete   CULTURE, BLOOD (ROUTINE X 2)     Status: Normal (Preliminary result)   Collection Time   11/04/11  2:00 PM      Component Value Range Status Comment   Specimen Description BLOOD HAND RIGHT   Final    Special Requests BOTTLES DRAWN AEROBIC ONLY 10CC BLUE   Final    Setup Time [id number]   Final    Culture     Final    Value:        BLOOD CULTURE RECEIVED NO GROWTH TO DATE CULTURE WILL BE HELD FOR 5 DAYS BEFORE ISSUING A FINAL NEGATIVE REPORT   Report Status PENDING   Incomplete      Basename 11/07/11 0925  NA 139  K 4.0  CL 105  CO2 26  GLUCOSE 128*  BUN 14  CREATININE 1.04  CALCIUM 8.6  MG 2.1  PHOS --   No results found for this basename: AST:2,ALT:2,ALKPHOS:2,BILITOT:2,PROT:2,ALBUMIN:2 in the last 72 hours No results found for this basename: LIPASE:2,AMYLASE:2 in the last 72 hours  Basename 11/07/11 0925  WBC 9.1  NEUTROABS --  HGB 8.4*  HCT 26.0*  MCV 87.2  PLT 338    Scheduled Meds:    . carvedilol  3.125 mg Oral BID WC  . clopidogrel  75 mg Oral Q breakfast  . collagenase   Topical Daily  . doxycycline  100 mg Oral Q12H  . fentaNYL  50 mcg Transdermal Q72H  . gabapentin  300 mg Oral TID  . heparin  5,000 Units Subcutaneous Q8H  . insulin aspart  0-9 Units Subcutaneous Q4H  . levETIRAcetam  500 mg Oral BID  . lisinopril  2.5 mg Oral Daily  . Tamsulosin HCl  0.4 mg Oral QHS  . DISCONTD: piperacillin-tazobactam (ZOSYN)  IV  3.375 g Intravenous Q8H  . DISCONTD: simvastatin  20 mg Oral QHS  . DISCONTD: vancomycin  1,000 mg Intravenous  Q12H   Continuous Infusions:   PRN Meds:.albuterol, bisacodyl, metoprolol, morphine injection, ondansetron (ZOFRAN) IV, ondansetron

## 2011-11-07 NOTE — Clinical Documentation Improvement (Signed)
PRESSURE ULCER DOCUMENTATION CLARIFICATION QUERY  Please update your documentation within the medical record to reflect your response to this query.                                                                                     11/07/11  Dear Dr[facility]  In a better effort to capture your patient's severity of illness, reflect appropriate length of stay and utilization of resources, a review of the patient medical record has revealed the following indicators.    Based on your clinical judgment, please clarify and document in a progress note and/or discharge summary the clinical condition associated with the following supporting information:                        PLEASE NOTE IF PRESENT ON ADMISSION     Possible Clinical Conditions?   _______Stage  I  Pressure Ulcer   (reddening of the skin)  _______Stage  II Pressure Ulcer  (blister open or unopened)  _______Stage  III Pressure Ulcer (through all layers skin)  _______Stage IV Pressure Ulcer   (through skin & underlying  muscle, tendons, and bones)  _______Unstageable  _______Other Condition_____________  _______Cannot Clinically Determine     Supporting Information:  "Stable left heel decubitus ulcer with no signs of osteomyelitis" per notes on 11-13  Signs/Symptoms: "Skin is dry and intact except a necrotic wound covering the entire bottom of LT heel, no drainage; Mepliex placed over top"  TREATMENT  Would continue with the protective foam boots. With start Santyl dressing changes daily. Do not feel that surgical intervention is necessary at this time. I can followup as an outpatient after discharge.  Reviewed: documented unstageable Thank You,  Sincerely, Rossie Muskrat  RN,BSN Clinical Documentation Specialist:  Pager:  [phone] Health Information Management 

## 2011-11-07 NOTE — Progress Notes (Signed)
Patient was discussed during progression meeting. Per MD, patient is not ready to dc until 1 or 2 days. CSW called SNF and updated facility on dc plan. SNF still agreeable to accepting patient. CSW will continue to follow to assist with dc needs.

## 2011-11-07 NOTE — Progress Notes (Signed)
INITIAL ADULT NUTRITION ASSESSMENT Date: 11/07/2011   Time: 3:20 PM  Reason for Assessment: Nutrition Risk Report, Low Braden  ASSESSMENT: Male 77 y.o.  Dx: Mental status, decreased  Hx:  Past Medical History  Diagnosis Date  . Diabetes mellitus   . Hypertension   . CHF (congestive heart failure)   . CAD (coronary artery disease)   . Pacemaker   . TIA (transient ischemic attack)   . Stroke   . Hyperlipidemia   . Spinal stenosis   . DJD (degenerative joint disease)   . Peripheral vascular disease   . Hemorrhoids   . Carotid artery occlusion   . Dysphagia S/P CVA (cerebrovascular accident)     Related Meds:     . carvedilol  3.125 mg Oral BID WC  . clopidogrel  75 mg Oral Q breakfast  . collagenase   Topical Daily  . doxycycline  100 mg Oral Q12H  . fentaNYL  50 mcg Transdermal Q72H  . gabapentin  300 mg Oral TID  . heparin  5,000 Units Subcutaneous Q8H  . insulin aspart  0-9 Units Subcutaneous Q4H  . levETIRAcetam  500 mg Oral BID  . lisinopril  2.5 mg Oral Daily  . Tamsulosin HCl  0.4 mg Oral QHS  . DISCONTD: piperacillin-tazobactam (ZOSYN)  IV  3.375 g Intravenous Q8H  . DISCONTD: simvastatin  20 mg Oral QHS  . DISCONTD: vancomycin  1,000 mg Intravenous Q12H     Ht: 5\' 10"  (177.8 cm)  Wt: 162 lb 0.6 oz (73.5 kg)  Ideal Wt: 75.4 kg % Ideal Wt: 97%  Usual Wt: unable to obtain % Usual Wt: ---  Body mass index is 23.25 kg/(m^2).  Food/Nutrition Related Hx: dysphagia per nutrition screen  Labs: Sodium 139 mEq/L      Potassium 4.0 mEq/L      Chloride 105 mEq/L      CO2 26 mEq/L      Glucose, Bld 128 mg/dL H     BUN 14 mg/dL      Creatinine, Ser 1.61 mg/dL      Calcium 8.6 mg/dL  I/O last 3 completed shifts: In: 2106.3 [P.O.:480; I.V.:776.3; IV Piggyback:850] Out: 650 [Urine:650]     Diet Order: Dysphagia 1, thin liquids  Supplements/Tube Feeding: N/A  IVF: saline lock  Estimated Nutritional Needs:   Kcal: 1,700-1,900 Protein: 90-100  gms Fluid: 1.7-1.9 L  RD unable to obtain much nutrition hx from pt; states his appetite is "pretty good"; PO intake 10-50% per records; bedside swallow evaluation 11/10; noted deep tissue injury on outer area of foot -- pt at risk for further skin breakdown as braden score low --- pt would benefit from supplemental addition, likes Ensure -- RD to order.  NUTRITION DIAGNOSIS: -Inadequate oral intake (NI-2.1).  Status: Ongoing  RELATED TO: dysphagia, AMS  AS EVIDENCE BY: PO's 10-50% per records  MONITORING/EVALUATION(Goals): Goal: pt to meet >90% of estimated nutrition needs to promote wound healing Monitor: PO intake, weight, labs, wound care  EDUCATION NEEDS: -Education not appropriate at this time  INTERVENTION:  Add Ensure Clinical Strength PO TID  RD to follow for nutrition care plan  Dietitian #: [phone]  DOCUMENTATION CODES Per approved criteria  -Not Applicable    Kirkland Hun, RD, LDN 11/07/2011, 3:20 PM

## 2011-11-07 NOTE — Consult Note (Signed)
Reason for Consult: Left heel ulcer Referring Physician:Dr. Croy [name] is an 77 y.o. male.  HPI: Patient is an 77 year old gentleman history of diabetes stroke who has a chronic left heel decubitus ulcer.  Past Medical History  Diagnosis Date  . Diabetes mellitus   . Hypertension   . CHF (congestive heart failure)   . CAD (coronary artery disease)   . Pacemaker   . TIA (transient ischemic attack)   . Stroke   . Hyperlipidemia   . Spinal stenosis   . DJD (degenerative joint disease)   . Peripheral vascular disease   . Hemorrhoids   . Carotid artery occlusion   . Dysphagia S/P CVA (cerebrovascular accident)     Past Surgical History  Procedure Date  . Pacemaker placement     No family history on file.  Social History:  reports that he has quit smoking. He does not have any smokeless tobacco history on file. He reports that he does not drink alcohol or use illicit drugs.  Allergies: No Known Allergies  Medications: I have reviewed the patient's current medications.  Results for orders placed during the hospital encounter of 11/03/11 (from the past 48 hour(s))  GLUCOSE, CAPILLARY     Status: Abnormal   Collection Time   11/05/11  7:50 AM      Component Value Range Comment   Glucose-Capillary 150 (*) 70 - 99 (mg/dL)   GLUCOSE, CAPILLARY     Status: Abnormal   Collection Time   11/05/11 11:52 AM      Component Value Range Comment   Glucose-Capillary 176 (*) 70 - 99 (mg/dL)   GLUCOSE, CAPILLARY     Status: Abnormal   Collection Time   11/05/11  4:03 PM      Component Value Range Comment   Glucose-Capillary 192 (*) 70 - 99 (mg/dL)   GLUCOSE, CAPILLARY     Status: Abnormal   Collection Time   11/05/11  8:24 PM      Component Value Range Comment   Glucose-Capillary 144 (*) 70 - 99 (mg/dL)    Comment 1 Notify RN     GLUCOSE, CAPILLARY     Status: Abnormal   Collection Time   11/05/11 11:55 PM      Component Value Range Comment   Glucose-Capillary 122  (*) 70 - 99 (mg/dL)    Comment 1 Notify RN     GLUCOSE, CAPILLARY     Status: Abnormal   Collection Time   11/06/11  4:47 AM      Component Value Range Comment   Glucose-Capillary 156 (*) 70 - 99 (mg/dL)    Comment 1 Notify RN     GLUCOSE, CAPILLARY     Status: Abnormal   Collection Time   11/06/11  8:02 AM      Component Value Range Comment   Glucose-Capillary 130 (*) 70 - 99 (mg/dL)   GLUCOSE, CAPILLARY     Status: Abnormal   Collection Time   11/06/11 11:57 AM      Component Value Range Comment   Glucose-Capillary 231 (*) 70 - 99 (mg/dL)   GLUCOSE, CAPILLARY     Status: Abnormal   Collection Time   11/06/11  4:45 PM      Component Value Range Comment   Glucose-Capillary 164 (*) 70 - 99 (mg/dL)    Comment 1 Notify RN     GLUCOSE, CAPILLARY     Status: Abnormal   Collection Time   11/07/11 12:29 AM  Component Value Range Comment   Glucose-Capillary 111 (*) 70 - 99 (mg/dL)    Comment 1 Notify RN     GLUCOSE, CAPILLARY     Status: Abnormal   Collection Time   11/07/11  4:03 AM      Component Value Range Comment   Glucose-Capillary 129 (*) 70 - 99 (mg/dL)    Comment 1 Notify RN       Dg Foot 2 Views Left  11/06/2011  *RADIOLOGY REPORT*  Clinical Data: Heel wound  LEFT FOOT - 2 VIEW  Comparison: 09/25/2011  Findings: Negative for fracture.  Negative for osteomyelitis of the calcaneus.  Spurring of the calcaneus is present.  There is advanced arterial calcification.  IMPRESSION: Negative for fracture or osteomyelitis of the calcaneus.  Original Report Authenticated By: Camelia Phenes, M.D.    ROS Blood pressure 179/67, pulse 54, temperature 97.9 F (36.6 C), temperature source Oral, resp. rate 18, height 5\' 10"  (1.778 m), weight 73.5 kg (162 lb 0.6 oz), SpO2 100.00%. Physical Exam on examination patient does not have palpable dorsalis pedis or posterior posterior tibial pulse he has a stable eschar covering the entire calcaneus. There is no abscess no purulence no signs  of deep infection. Review of the x-rays shows calcified vessels at the ankle. Patient is contracted and nonambulatory. He is wearing good protective foam boots.  Assessment/Plan: Stable left heel decubitus ulcer with no signs of osteomyelitis. Would continue with the protective foam boots. With start Santyl dressing changes daily. Do not feel that surgical intervention is necessary at this time. I can followup as an outpatient after discharge.  [name] V 11/07/2011, 6:32 AM

## 2011-11-08 DIAGNOSIS — L97409 Non-pressure chronic ulcer of unspecified heel and midfoot with unspecified severity: Secondary | ICD-10-CM

## 2011-11-08 DIAGNOSIS — I739 Peripheral vascular disease, unspecified: Secondary | ICD-10-CM

## 2011-11-08 LAB — GLUCOSE, CAPILLARY
Glucose-Capillary: 285 mg/dL — ABNORMAL HIGH (ref 70–99)
Glucose-Capillary: 101 mg/dL — ABNORMAL HIGH (ref 70–99)
Glucose-Capillary: 121 mg/dL — ABNORMAL HIGH (ref 70–99)

## 2011-11-08 MED ORDER — INSULIN ASPART 100 UNIT/ML ~~LOC~~ SOLN
0.0000 [IU] | Freq: Every day | SUBCUTANEOUS | Status: DC
  Administered 2011-11-09 – 2011-11-12 (×2): 3 [IU] via SUBCUTANEOUS

## 2011-11-08 MED ORDER — ACETAMINOPHEN 325 MG PO TABS
650.0000 mg | ORAL_TABLET | ORAL | Status: DC | PRN
  Administered 2011-11-08 – 2011-11-13 (×4): 650 mg via ORAL
  Filled 2011-11-08 (×5): qty 2

## 2011-11-08 MED ORDER — INSULIN ASPART 100 UNIT/ML ~~LOC~~ SOLN
0.0000 [IU] | Freq: Three times a day (TID) | SUBCUTANEOUS | Status: DC
  Administered 2011-11-08: 2 [IU] via SUBCUTANEOUS
  Administered 2011-11-09: 3 [IU] via SUBCUTANEOUS
  Administered 2011-11-09 (×2): 2 [IU] via SUBCUTANEOUS
  Administered 2011-11-10: 5 [IU] via SUBCUTANEOUS
  Administered 2011-11-10: 3 [IU] via SUBCUTANEOUS
  Administered 2011-11-10: 1 [IU] via SUBCUTANEOUS
  Administered 2011-11-11: 3 [IU] via SUBCUTANEOUS
  Administered 2011-11-11: 2 [IU] via SUBCUTANEOUS
  Administered 2011-11-11: 1 [IU] via SUBCUTANEOUS
  Administered 2011-11-12: 3 [IU] via SUBCUTANEOUS
  Administered 2011-11-13 (×2): 1 [IU] via SUBCUTANEOUS
  Administered 2011-11-13: 3 [IU] via SUBCUTANEOUS
  Administered 2011-11-14: 2 [IU] via SUBCUTANEOUS

## 2011-11-08 NOTE — Progress Notes (Signed)
Speech Pathology: Dysphagia Treatment Note  Patient was observed with : Pureed and Thin liquids.  Patient was noted to have s/s of aspiration : no  Lung Sounds: SpO2 96% Temperature: 97.9  Patient required:  Max verbal cues to consistently follow precautions/strategies  Recommendations: Continue on current diet, Dysphagia 1 (puree) and thin liquids.   Pt showed no s/s of aspiration, however reported significant lethargy and appeared to have decreased alertness. SLP moderately assisted with pt's self feeding of thin liquids, which pt had minimal right anterior labial spillage with. SLP administered feeding of puree, having pt turn to left to receive bolus orally with mod verbal cues to acknowledge SLP on left side. Given current decreased alertness, recommend continue current diet of dys 1 (puree) and thin liquids, with great potential for advancement with increased alertness and decreased lethargy.   Pain:   1 /10 Intervention Required:   no  Goals: Progressing towards goal.   [name], Speech Pathology Student 11/08/2011

## 2011-11-08 NOTE — Progress Notes (Signed)
ABI completed at 15:40.  Preliminary report is ABI indicates severe reduction in arterial flow and great toe PPG waveforms appear flat bilaterally.   Smiley Houseman 11/08/2011, 4:11 PM

## 2011-11-08 NOTE — Plan of Care (Signed)
Problem: Phase II Progression Outcomes Goal: Other Phase II Outcomes/Goals Outcome: Progressing Tolerating diet

## 2011-11-08 NOTE — Progress Notes (Signed)
Subjective: Pt is lethargic, answering simple questions. Denies any pain.  Objective: Vital signs in last 24 hours: Filed Vitals:   11/07/11 1830 11/07/11 2134 11/08/11 0518 11/08/11 0939  BP: 173/57 130/75 124/72 136/82  Pulse: 75 74 66   Temp:  97.9 F (36.6 C) 98.1 F (36.7 C)   TempSrc:  Axillary Axillary   Resp:  17 18   Height:      Weight:   74.6 kg (164 lb 7.4 oz)   SpO2:  98% 96%    Weight change: 1.1 kg (2 lb 6.8 oz)  Intake/Output Summary (Last 24 hours) at 11/08/11 1251 Last data filed at 11/08/11 3086  Gross per 24 hour  Intake    120 ml  Output    350 ml  Net   -230 ml   Physical Exam:   General Appearance:    Lethargic, answering simple questions, not in distress.   Lungs:     Clear to auscultation bilaterally, respirations unlabored   Heart:    Regular rate and rhythm, S1 and S2 normal, no murmur,  Abdomen:     Soft, non-tender, bowel sounds active all four quadrants,    no masses, no organomegaly  Extremities:   Left lower extremity decubitus ulcers. B/l lower extremitits in boots.   Neuro:          Pt is alert and oriented to person only.      Lab Results: Results for orders placed during the hospital encounter of 11/03/11 (from the past 24 hour(s))  GLUCOSE, CAPILLARY     Status: Abnormal   Collection Time   11/07/11  1:24 PM      Component Value Range   Glucose-Capillary 156 (*) 70 - 99 (mg/dL)  GLUCOSE, CAPILLARY     Status: Abnormal   Collection Time   11/07/11  4:16 PM      Component Value Range   Glucose-Capillary 180 (*) 70 - 99 (mg/dL)  GLUCOSE, CAPILLARY     Status: Abnormal   Collection Time   11/07/11  6:34 PM      Component Value Range   Glucose-Capillary 143 (*) 70 - 99 (mg/dL)  GLUCOSE, CAPILLARY     Status: Abnormal   Collection Time   11/07/11  8:28 PM      Component Value Range   Glucose-Capillary 182 (*) 70 - 99 (mg/dL)  GLUCOSE, CAPILLARY     Status: Abnormal   Collection Time   11/07/11 11:40 PM      Component  Value Range   Glucose-Capillary 155 (*) 70 - 99 (mg/dL)  GLUCOSE, CAPILLARY     Status: Abnormal   Collection Time   11/08/11  4:16 AM      Component Value Range   Glucose-Capillary 285 (*) 70 - 99 (mg/dL)  GLUCOSE, CAPILLARY     Status: Abnormal   Collection Time   11/08/11  7:49 AM      Component Value Range   Glucose-Capillary 62 (*) 70 - 99 (mg/dL)   Comment 1 Documented in Chart    GLUCOSE, CAPILLARY     Status: Abnormal   Collection Time   11/08/11  8:30 AM      Component Value Range   Glucose-Capillary 101 (*) 70 - 99 (mg/dL)   Comment 1 Documented in Chart    GLUCOSE, CAPILLARY     Status: Abnormal   Collection Time   11/08/11 11:47 AM      Component Value Range   Glucose-Capillary 121 (*)  70 - 99 (mg/dL)   Comment 1 Documented in Chart      Micro Results: Recent Results (from the past 240 hour(s))  MRSA PCR SCREENING     Status: Normal   Collection Time   11/03/11 10:32 PM      Component Value Range Status Comment   MRSA by PCR NEGATIVE  NEGATIVE  Final   CULTURE, BLOOD (ROUTINE X 2)     Status: Normal (Preliminary result)   Collection Time   11/04/11  1:46 PM      Component Value Range Status Comment   Specimen Description BLOOD ARM RIGHT   Final    Special Requests BOTTLES DRAWN AEROBIC AND ANAEROBIC 10CC EACH   Final    Setup Time [id number]   Final    Culture     Final    Value:        BLOOD CULTURE RECEIVED NO GROWTH TO DATE CULTURE WILL BE HELD FOR 5 DAYS BEFORE ISSUING A FINAL NEGATIVE REPORT   Report Status PENDING   Incomplete   CULTURE, BLOOD (ROUTINE X 2)     Status: Normal (Preliminary result)   Collection Time   11/04/11  2:00 PM      Component Value Range Status Comment   Specimen Description BLOOD HAND RIGHT   Final    Special Requests BOTTLES DRAWN AEROBIC ONLY 10CC BLUE   Final    Setup Time [id number]   Final    Culture     Final    Value:        BLOOD CULTURE RECEIVED NO GROWTH TO DATE CULTURE WILL BE HELD FOR 5 DAYS BEFORE ISSUING A  FINAL NEGATIVE REPORT   Report Status PENDING   Incomplete    Studies/Results: No results found. Medications: reviewed.  Scheduled Meds:   . carvedilol  3.125 mg Oral BID WC  . clopidogrel  75 mg Oral Q breakfast  . collagenase   Topical Daily  . doxycycline  100 mg Oral Q12H  . feeding supplement  237 mL Oral TID WC  . fentaNYL  50 mcg Transdermal Q72H  . gabapentin  300 mg Oral TID  . heparin  5,000 Units Subcutaneous Q8H  . insulin aspart  0-9 Units Subcutaneous Q4H  . levETIRAcetam  500 mg Oral BID  . lisinopril  2.5 mg Oral Daily  . Tamsulosin HCl  0.4 mg Oral QHS   Continuous Infusions:  PRN Meds:.acetaminophen, albuterol, bisacodyl, metoprolol, morphine injection, ondansetron (ZOFRAN) IV, ondansetron Assessment/Plan: 1. Altered mental status: probably secondary to drug overdose ( morphine) , has a h/o cva with left hemiparesis, ruled out Underlying seizure activity - stable Neuro checks, reduced PO pain meds and muscle relaxants,continue Plavix, continue Asp precautions. Speech & PT consult appreciated. Per Neuro no CVA, Neuro agrees stable EEG , but therapeutic trail of Keppra per discussion with Dr Irine Seal 11-07-11, Overall mentation much better, ? At baseline    2. Chr Sytolic CHF post pacemaker - compensated - fluid restriction - home meds at lowered doses- B blocker and ACE (with hold paremeters)+ fluid restriction.  3. DM-2 - stable on ISS - Lantus if needed    4. Chr Hip pain : ortho input appreciated. On pain control and pt /ot.  5. Chr L. Hell -foot ulcers - wound care, continue boots. Wound looks dry - no obvious cellulits, ,ABI are still pending. , Ortho-Dr Lajoyce Corners evaluated no signs of Osteo, X ray stable, await ABI. ? Pain due to combination of Neuropathy +  Ischemia.  6. Leukocytosis - initial, resolved now. stable UA and CXR. Monitor clinically, repeat CXR again stable,  Blood Cult contaminant D/W Dr Luciana Axe 11-07-11, DC Vanco, Po Doxy 5 days for Mild URI. Much improved  now.  DVT prophylaxis - Heparin Disposition: probable d/c to SNF in one to two days.     LOS: 5 days   Ree Alcalde 11/08/2011, 12:51 PM

## 2011-11-08 NOTE — Progress Notes (Signed)
Subjective: Patient is slightly drowsy but able to follow commands.  He knows [employer] is president but unable to tell me year or where he is.    Objective: Vital signs in last 24 hours: Temp:  [97.9 F (36.6 C)-98.5 F (36.9 C)] 98.1 F (36.7 C) (11/14 0518) Pulse Rate:  [54-75] 66  (11/14 0518) Resp:  [17-18] 18  (11/14 0518) BP: (124-176)/(57-75) 124/72 mmHg (11/14 0518) SpO2:  [96 %-98 %] 96 % (11/14 0518) Weight:  [74.6 kg (164 lb 7.4 oz)] 164 lb 7.4 oz (74.6 kg) (11/14 0518)  Intake/Output from previous day: 11/13 0701 - 11/14 0700 In: -  Out: 350 [Urine:350] Intake/Output this shift: Total I/O In: 120 [P.O.:120] Out: -  Nutritional status: Dysphagia  Past Medical History  Diagnosis Date  . Diabetes mellitus   . Hypertension   . CHF (congestive heart failure)   . CAD (coronary artery disease)   . Pacemaker   . TIA (transient ischemic attack)   . Stroke   . Hyperlipidemia   . Spinal stenosis   . DJD (degenerative joint disease)   . Peripheral vascular disease   . Hemorrhoids   . Carotid artery occlusion   . Dysphagia S/P CVA (cerebrovascular accident)     Neurologic Exam: Mental Status: Alert, not oriented, Speech dysarthric however patient does not have dentures in. no aphasia. Able to follow 3 step commands without difficulty. Cranial Nerves: II-Visual fields grossly intact. III/IV/VI-Extraocular movements intact but limited in all 6 cardinal gazes.  Pupils reactive bilaterally. V/VII-Smile symmetric when asked to smile and make facial movements.  At rest his lower lip droops to the right secondary to dentures not in VIII-grossly intact IX/X-normal gag XI-bilateral shoulder shrug XII-midline tongue extension Motor: 5/5 bilaterally Upper extremities bilateral Legs are held in flexion at the knee with contraction at baseline.  He is able to flex his knees with 4/5 strength.  Increased tone in lower extremities as mentioned before. Atrophy in Bil. LE.  Feet in  padded boots for ulcer protection.  Right grip greater than Left Sensory:  light touch intact throughout, bilaterally Deep Tendon Reflexes: 1+ and symmetric throughout Plantars: equivical    Lab Results:  Basename 11/07/11 0925  WBC 9.1  HGB 8.4*  HCT 26.0*  PLT 338  NA 139  K 4.0  CL 105  CO2 26  GLUCOSE 128*  BUN 14  CREATININE 1.04  CALCIUM 8.6  LABA1C --   Lipid Panel No results found for this basename: CHOL,TRIG,HDL,CHOLHDL,VLDL,LDLCALC in the last 72 hours  Studies/Results: Dg Foot 2 Views Left  11/06/2011  *RADIOLOGY REPORT*  Clinical Data: Heel wound  LEFT FOOT - 2 VIEW  Comparison: 09/25/2011  Findings: Negative for fracture.  Negative for osteomyelitis of the calcaneus.  Spurring of the calcaneus is present.  There is advanced arterial calcification.  IMPRESSION: Negative for fracture or osteomyelitis of the calcaneus.  Original Report Authenticated By: Camelia Phenes, M.D.   CTA NECK IMPRESSION:  1. Calcification at the origin of the right vertebral artery with a probable high-grade stenosis. 2. Marginal calcification at the origin of the left vertebral artery without significant proximal stenosis. 3. Tandem stenoses are present in the proximal left vertebral artery as the vessel enters the foramen transversarium. 4. Atherosclerotic calcifications and noncalcified plaque at the carotid bifurcations bilaterally without significant stenosis. 5. Moderate tortuosity of the innominate and left common carotid Arteries.  CTA HEAD IMPRESSION:  1. Moderate small vessel disease. 2. Focal stenosis of the distal right M1  segment. 3. Dense atherosclerotic calcifications involving the cavernous carotid arteries bilaterally with left greater than right moderate stenoses. 4. 50% stenosis of the basilar artery. 5. Atherosclerotic calcifications at the dural margins of the vertebral arteries bilaterally. 6. Diffuse white matter hypoattenuation, compatible with  small vessel disease.  CT HEAD IMPRESSION: No intracranial hemorrhage or CT evidence of large acute infarct.  Remote infarcts and small vessel disease type changes.    Medications:  Scheduled:   . carvedilol  3.125 mg Oral BID WC  . clopidogrel  75 mg Oral Q breakfast  . collagenase   Topical Daily  . doxycycline  100 mg Oral Q12H  . feeding supplement  237 mL Oral TID WC  . fentaNYL  50 mcg Transdermal Q72H  . gabapentin  300 mg Oral TID  . heparin  5,000 Units Subcutaneous Q8H  . insulin aspart  0-9 Units Subcutaneous Q4H  . levETIRAcetam  500 mg Oral BID  . lisinopril  2.5 mg Oral Daily  . Tamsulosin HCl  0.4 mg Oral QHS  . DISCONTD: piperacillin-tazobactam (ZOSYN)  IV  3.375 g Intravenous Q8H  . DISCONTD: simvastatin  20 mg Oral QHS  . DISCONTD: vancomycin  1,000 mg Intravenous Q12H    Assessment/Plan:  77 years old man who came in likely very lethargic due to morphine. CT head (-). EEG done with the patient, drowsy  is normal on 11/06/11.  Currently on Keppra 500 mg BID for seizure prophylaxis first dose on 11-07-11-- No Side effects noted.    No further neurological recommendations at this time.   Felicie Morn PA-C [facility] [phone]  11/08/2011, 9:21 AM

## 2011-11-09 ENCOUNTER — Inpatient Hospital Stay (HOSPITAL_COMMUNITY): Payer: Medicare Other

## 2011-11-09 ENCOUNTER — Other Ambulatory Visit: Payer: Self-pay

## 2011-11-09 LAB — DIFFERENTIAL
Basophils Absolute: 0 10*3/uL (ref 0.0–0.1)
Basophils Relative: 0 % (ref 0–1)
Eosinophils Relative: 2 % (ref 0–5)
Lymphocytes Relative: 14 % (ref 12–46)
Monocytes Absolute: 1.6 10*3/uL — ABNORMAL HIGH (ref 0.1–1.0)
Monocytes Relative: 11 % (ref 3–12)
Neutro Abs: 10.8 10*3/uL — ABNORMAL HIGH (ref 1.7–7.7)

## 2011-11-09 LAB — BASIC METABOLIC PANEL
BUN: 20 mg/dL (ref 6–23)
CO2: 28 mEq/L (ref 19–32)
Calcium: 9.1 mg/dL (ref 8.4–10.5)
Chloride: 106 mEq/L (ref 96–112)
Creatinine, Ser: 1.34 mg/dL (ref 0.50–1.35)

## 2011-11-09 LAB — CBC
HCT: 28.8 % — ABNORMAL LOW (ref 39.0–52.0)
Hemoglobin: 9.2 g/dL — ABNORMAL LOW (ref 13.0–17.0)
MCHC: 31.9 g/dL (ref 30.0–36.0)
MCV: 89.2 fL (ref 78.0–100.0)
RDW: 14.7 % (ref 11.5–15.5)

## 2011-11-09 MED ORDER — ACETAMINOPHEN 650 MG RE SUPP
650.0000 mg | RECTAL | Status: DC | PRN
  Administered 2011-11-09 – 2011-11-12 (×2): 650 mg via RECTAL
  Filled 2011-11-09: qty 1

## 2011-11-09 MED ORDER — PIPERACILLIN-TAZOBACTAM 3.375 G IVPB
3.3750 g | Freq: Three times a day (TID) | INTRAVENOUS | Status: DC
  Administered 2011-11-09 – 2011-11-13 (×11): 3.375 g via INTRAVENOUS
  Filled 2011-11-09 (×13): qty 50

## 2011-11-09 MED ORDER — HYDRALAZINE HCL 20 MG/ML IJ SOLN
10.0000 mg | Freq: Four times a day (QID) | INTRAMUSCULAR | Status: DC | PRN
  Administered 2011-11-09 – 2011-11-11 (×2): 10 mg via INTRAVENOUS
  Filled 2011-11-09 (×2): qty 0.5

## 2011-11-09 MED ORDER — SODIUM CHLORIDE 0.9 % IV SOLN
INTRAVENOUS | Status: AC
  Administered 2011-11-09: 12:00:00 via INTRAVENOUS

## 2011-11-09 MED ORDER — SODIUM CHLORIDE 0.9 % IV BOLUS (SEPSIS)
500.0000 mL | Freq: Once | INTRAVENOUS | Status: AC
  Administered 2011-11-09: 500 mL via INTRAVENOUS

## 2011-11-09 MED ORDER — CARVEDILOL 3.125 MG PO TABS
3.1250 mg | ORAL_TABLET | Freq: Two times a day (BID) | ORAL | Status: DC
  Administered 2011-11-09 – 2011-11-14 (×10): 3.125 mg via ORAL
  Filled 2011-11-09 (×12): qty 1

## 2011-11-09 MED ORDER — VANCOMYCIN HCL 1000 MG IV SOLR
750.0000 mg | Freq: Two times a day (BID) | INTRAVENOUS | Status: DC
  Administered 2011-11-09 – 2011-11-11 (×5): 750 mg via INTRAVENOUS
  Filled 2011-11-09 (×5): qty 750

## 2011-11-09 NOTE — Progress Notes (Signed)
Subjective: Was called to room due to mental status change.  Upon entering room patient is very drowsy but will follow commands to grasping my fingers, looking in my direction, stating he is in [place] hospital.  Rectal temperature showed temp of 101 and BP 78/ 47 and O2 sat of 89%.    Objective: Vital signs in last 24 hours: Temp:  [97.9 F (36.6 C)] 97.9 F (36.6 C) (11/14 2100) Pulse Rate:  [54-75] 75  (11/15 0426) Resp:  [18-20] 18  (11/15 0426) BP: (107-190)/(50-91) 107/64 mmHg (11/15 0908) SpO2:  [93 %-97 %] 93 % (11/15 0426)  Intake/Output from previous day: 11/14 0701 - 11/15 0700 In: 195 [P.O.:195] Out: 500 [Urine:500] Intake/Output this shift: Total I/O In: 130 [P.O.:130] Out: -  Nutritional status: Dysphagia  Past Medical History  Diagnosis Date  . Diabetes mellitus   . Hypertension   . CHF (congestive heart failure)   . CAD (coronary artery disease)   . Pacemaker   . TIA (transient ischemic attack)   . Stroke   . Hyperlipidemia   . Spinal stenosis   . DJD (degenerative joint disease)   . Peripheral vascular disease   . Hemorrhoids   . Carotid artery occlusion   . Dysphagia S/P CVA (cerebrovascular accident)     Neurologic Exam: Mental Status: Alert,very lethargic but awakens to external stimuli Speech dysarthric however patient does not have dentures in. no aphasia. Able to follow 2 step commands without difficulty. Cranial Nerves: II-Visual fields grossly intact blinks to threat. III/IV/VI-Extraocular movements intact but limited in all 6 cardinal gazes.  Pupils reactive bilaterally. V/VII-Smile symmetric when asked to smile and make facial movements.  At rest his lower lip droops to the right secondary to dentures not in VIII-grossly intact IX/X-normal gag XI-bilateral shoulder shrug XII-midline tongue extension Motor: 4/5 bilaterally Upper extremities bilateral Legs are held in flexion at the knee with contraction at baseline.  He is able to flex  his knees with 4/5 strength.  Increased tone in lower extremities as mentioned before. Atrophy in Bil. LE.  Feet in padded boots for ulcer protection.  Right grip greater than Left Sensory:  light touch intact throughout, bilaterally Deep Tendon Reflexes: 1+ and symmetric throughout Plantars: equivical    Lab Results:  Basename 11/07/11 0925  WBC 9.1  HGB 8.4*  HCT 26.0*  PLT 338  NA 139  K 4.0  CL 105  CO2 26  GLUCOSE 128*  BUN 14  CREATININE 1.04  CALCIUM 8.6  LABA1C --   Lipid Panel No results found for this basename: CHOL,TRIG,HDL,CHOLHDL,VLDL,LDLCALC in the last 72 hours  Studies/Results: No results found. CTA NECK IMPRESSION:  1. Calcification at the origin of the right vertebral artery with a probable high-grade stenosis. 2. Marginal calcification at the origin of the left vertebral artery without significant proximal stenosis. 3. Tandem stenoses are present in the proximal left vertebral artery as the vessel enters the foramen transversarium. 4. Atherosclerotic calcifications and noncalcified plaque at the carotid bifurcations bilaterally without significant stenosis. 5. Moderate tortuosity of the innominate and left common carotid Arteries.  CTA HEAD IMPRESSION:  1. Moderate small vessel disease. 2. Focal stenosis of the distal right M1 segment. 3. Dense atherosclerotic calcifications involving the cavernous carotid arteries bilaterally with left greater than right moderate stenoses. 4. 50% stenosis of the basilar artery. 5. Atherosclerotic calcifications at the dural margins of the vertebral arteries bilaterally. 6. Diffuse white matter hypoattenuation, compatible with small vessel disease.  CT HEAD IMPRESSION: No  intracranial hemorrhage or CT evidence of large acute infarct.  Remote infarcts and small vessel disease type changes.    Medications:  Scheduled:   . carvedilol  3.125 mg Oral BID WC  . clopidogrel  75 mg Oral Q breakfast  .  collagenase   Topical Daily  . doxycycline  100 mg Oral Q12H  . feeding supplement  237 mL Oral TID WC  . fentaNYL  50 mcg Transdermal Q72H  . gabapentin  300 mg Oral TID  . heparin  5,000 Units Subcutaneous Q8H  . insulin aspart  0-9 Units Subcutaneous Q4H  . levETIRAcetam  500 mg Oral BID  . lisinopril  2.5 mg Oral Daily  . Tamsulosin HCl  0.4 mg Oral QHS  . DISCONTD: piperacillin-tazobactam (ZOSYN)  IV  3.375 g Intravenous Q8H  . DISCONTD: simvastatin  20 mg Oral QHS  . DISCONTD: vancomycin  1,000 mg Intravenous Q12H    Assessment/Plan:  77 years old man who came in likely very lethargic due to morphine. CT head (-). EEG done with the patient, drowsy  is normal on 11/06/11.  Currently on Keppra 500 mg BID for seizure prophylaxis first dose on 11-07-11-- No Side effects noted.    11/09/11  Over night patients BP have been elevated in the systolic range of 190-146.  Currently BP is 78/47 O2 sat 89% and tamp 101.1.  AMS most likely secondary to hypotension and elevated Temp.   Pt given fluids, Tylenol for temperature and 02 NRB.    TRH have ordered BC, UA and ordered CT head  Will continue to follow pt.  No further neurological recommendations at this time.   Felicie Morn PA-C [facility] [phone]  11/09/2011, 10:18 AM

## 2011-11-09 NOTE — Consult Note (Signed)
Consult Note from the Palliative Medicine Team at [facility]  Consult Requested by: Dr. Blake Divine     PCP: [name], MD Reason for Consultation:GOC and Sx Rec.     Phone Number:[phone]  Assessment and Plan: 1. Code Status:DNI. Perform full cpr, use pressors, and antiarrythmics.  Pt's daughter has request to change to DNI status and she will continue to think through whether change to DNR is appropriate based on how he responds to current therapy.   2. Symptom Control: Pain related to PVD: at this time the patient may be having altered mental status related to the Fentanyl patch that was started on 11/06/11.  It is reasonable to remove the patch and use the prn morphine that is ordered which is appropriate. 3. Psycho/Social: Unable to fully assess at this time.  Definite family stressors especially on pt's dtr who is faced with making decisions on her own with full family support.  I will ask our social worker to touch base to support the patients daughter. 4. Spiritual:  Unable to fully assess 5. Disposition: To be determined.  Patient Documents Completed or Given: Document Given Completed  Advanced Directives Pkt    MOST    DNR    Gone from My Sight    Hard Choices      Brief HPI:Pt is an 77 yr old AA male who presented with altered mental status.  His daughter relates for the past few months he has taken a significant decline going from relatively independent to needing 24 /7 care .  He has had multiple strokes and continues to have difficulty with his heart.  His current hospital course was complicated by possible repeat stroke.  I was asked to meet with his daughter to discuss goals of care.   UJW:JXBJYN pain but further ROS difficult secondary to speech and cognition status.    PMH:  Past Medical History  Diagnosis Date  . Diabetes mellitus   . Hypertension   . CHF (congestive heart failure)   . CAD (coronary artery disease)   . Pacemaker   . TIA (transient  ischemic attack)   . Stroke   . Hyperlipidemia   . Spinal stenosis   . DJD (degenerative joint disease)   . Peripheral vascular disease   . Hemorrhoids   . Carotid artery occlusion   . Dysphagia S/P CVA (cerebrovascular accident)      PSH: Past Surgical History  Procedure Date  . Pacemaker placement     No Known Allergies Scheduled Meds:   . carvedilol  3.125 mg Oral BID WC  . clopidogrel  75 mg Oral Q breakfast  . collagenase   Topical Daily  . feeding supplement  237 mL Oral TID WC  . gabapentin  300 mg Oral TID  . heparin  5,000 Units Subcutaneous Q8H  . insulin aspart  0-5 Units Subcutaneous QHS  . insulin aspart  0-9 Units Subcutaneous TID WC  . levETIRAcetam  500 mg Oral BID  . lisinopril  2.5 mg Oral Daily  . piperacillin-tazobactam (ZOSYN)  IV  3.375 g Intravenous Q8H  . sodium chloride  500 mL Intravenous Once  . Tamsulosin HCl  0.4 mg Oral QHS  . vancomycin  750 mg Intravenous Q12H  . DISCONTD: carvedilol  3.125 mg Oral BID WC  . DISCONTD: doxycycline  100 mg Oral Q12H  . DISCONTD: fentaNYL  50 mcg Transdermal Q72H   Continuous Infusions:   . sodium chloride 40 mL/hr at 11/09/11 1130  PRN Meds:.acetaminophen, acetaminophen, albuterol, bisacodyl, hydrALAZINE, metoprolol, morphine injection, ondansetron (ZOFRAN) IV, ondansetron    BP 147/64  Pulse 82  Temp(Src) 98.8 F (37.1 C) (Oral)  Resp 18  Ht 5\' 10"  (1.778 m)  Wt 74.6 kg (164 lb 7.4 oz)  BMI 23.60 kg/m2  SpO2 99%   PPS:20%   Intake/Output Summary (Last 24 hours) at 11/09/11 1915 Last data filed at 11/09/11 1500  Gross per 24 hour  Intake   1020 ml  Output    675 ml  Net    345 ml   LBM:11/06/11                       Stool Softner: Dulcolax suppository  Physical Exam:  General: somnolent but rousable.  Tries to stay awake and eat and talk but speech very slurred  HEENT:  [place], left sided facial droop.  Poor dentition. Chest:   Decreased with some wet rhonchi.  CVS: regular rate and  rhythm +s1, S2 Abdomen:soft, nt, nd, positive bowel sounds Ext: thin with trace edema, heel decubitus  Neuro:sluggish , slurred speech but trying to communicate  Labs: CBC    Component Value Date/Time   WBC 14.8* 11/09/2011 1149   RBC 3.23* 11/09/2011 1149   HGB 9.2* 11/09/2011 1149   HCT 28.8* 11/09/2011 1149   PLT 322 11/09/2011 1149   MCV 89.2 11/09/2011 1149   MCH 28.5 11/09/2011 1149   MCHC 31.9 11/09/2011 1149   RDW 14.7 11/09/2011 1149   LYMPHSABS 2.1 11/09/2011 1149   MONOABS 1.6* 11/09/2011 1149   EOSABS 0.2 11/09/2011 1149   BASOSABS 0.0 11/09/2011 1149       CMP     Component Value Date/Time   NA 141 11/09/2011 1149   K 4.8 11/09/2011 1149   CL 106 11/09/2011 1149   CO2 28 11/09/2011 1149   GLUCOSE 170* 11/09/2011 1149   BUN 20 11/09/2011 1149   CREATININE 1.34 11/09/2011 1149   CALCIUM 9.1 11/09/2011 1149   PROT 6.6 11/03/2011 1419   ALBUMIN 2.1* 11/03/2011 1419   AST 45* 11/03/2011 1419   ALT 41 11/03/2011 1419   ALKPHOS 136* 11/03/2011 1419   BILITOT 0.4 11/03/2011 1419   GFRNONAA 48* 11/09/2011 1149   GFRAA 55* 11/09/2011 1149    Chest Xray Reviewed/Impressions:possible opacifications  CT scan of the Head Reviewed/Impressions:no acute bleed  Discussed with Dr. Blake Divine, and pt nurse.  Requested remove Fentanyl patch asap.  Time In Time Out Total Time Spent with Patient Total Overall Time  530 pm 645 pm 40 min 90 min   Greater than 50%  of this time was spent counseling and coordinating care related to the above assessment and plan.   Shawanna Zanders L. Ladona Ridgel, MD MBA The Palliative Medicine Team at [facility] Phone: [phone] Pager: [phone]

## 2011-11-09 NOTE — Progress Notes (Signed)
Speech Pathology: Dysphagia Treatment Note  Patient was observed with : Pureed and no liquids (secondary to AMS).  Patient was noted to have s/s of aspiration : Yes:  See below  Lung Sounds:  WNL Temperature:  97.9  Patient required: total cues to consistently follow precautions/strategies  Other: Patient very lethargic today. Assisted RN in administration of pills whole in applesauce. Patient able to answer orientation questions correctly however unable to follow commands, mouth remaining open without attempts to orally transit solid bolus in mouth despite maximum verbal, tactile, and visual cues. Oral cavity eventually suctioned and cleaned. Noted intermittent wet coughing towards end of session, ? management of secretions at this time given AMS. No attempts to swallow saliva noted in the 15 minutes this SLP was present in the room. RN planning to page MD regarding change in status.   Recommendations:  Keep NPO until mental status improves, then may resume previously recommended diet. If s/s of aspiration are noted, please page this SLP (information below). Will f/u in am 11/16.   Pain:   none Intervention Required:   No   Goals: not progressing secondary to change in mentation. will f/u   Ferdinand Lango MA, CCC-SLP [phone] Name change in 04/2011. Correct on license as Ferdinand Lango MA, CCC-SLP

## 2011-11-09 NOTE — Progress Notes (Addendum)
Palliative Medicine Team consult to address goals of care requested by Dr Blake Divine; spoke with patients daughter, Merrie Roof [phone]) meeting scheduled for today, Thursday 11/09/11 @ 5:30 pm  Valente David, RN 11/09/2011  11:55 AM Palliative Medicine Team RN Liaison [phone]  Added Palliative Care to identify service area ML

## 2011-11-09 NOTE — Progress Notes (Signed)
Reported to dr Ardyth Harps, Pt has systolic b/p of 190s, HR -50s, and had a 6 beat run of V-tach.  Dr Ardyth Harps has ordered Hydralazine for the B/p.  Pulse rate is too low to give Lopressor - PRN.

## 2011-11-09 NOTE — Progress Notes (Signed)
Per MD, patient is not ready to dc. CSW called SNF who stated they are willing to accept patient at dc but that daughter dropped bed hold today. SNF asked CSW to call SNF on day of dc to determine if any beds were available. CSW reviewed chart which stated that palliative care was consulted. CSW will continue to follow.

## 2011-11-09 NOTE — Progress Notes (Signed)
ANTIBIOTIC CONSULT NOTE - Initial(restart)  Pharmacy Consult for Vancomycin/Zosyn Indication: possible HCAP  No Known Allergies  Patient Measurements: Height: 5\' 10"  (177.8 cm) Weight: 164 lb 7.4 oz (74.6 kg) IBW/kg (Calculated) : 73    Vital Signs: Temp: 99.8 F (37.7 C) (11/15 1200) Temp src: Axillary (11/15 1200) BP: 126/39 mmHg (11/15 1200) Pulse Rate: 79  (11/15 1200) Intake/Output from previous day: 11/14 0701 - 11/15 0700 In: 195 [P.O.:195] Out: 500 [Urine:500] Intake/Output from this shift: Total I/O In: 130 [P.O.:130] Out: -   Labs:  Basename 11/09/11 1149 11/07/11 0925  WBC 14.8* 9.1  HGB 9.2* 8.4*  PLT 322 338  LABCREA -- --  CREATININE 1.34 1.04   Estimated Creatinine Clearance: 43.9 ml/min (by C-G formula based on Cr of 1.34). No results found for this basename: VANCOTROUGH:2,VANCOPEAK:2,VANCORANDOM:2,GENTTROUGH:2,GENTPEAK:2,GENTRANDOM:2,TOBRATROUGH:2,TOBRAPEAK:2,TOBRARND:2,AMIKACINPEAK:2,AMIKACINTROU:2,AMIKACIN:2, in the last 72 hours   Microbiology: Recent Results (from the past 720 hour(s))  URINE CULTURE     Status: Normal   Collection Time   10/19/11  3:32 PM      Component Value Range Status Comment   Specimen Description URINE, RANDOM   Final    Special Requests NONE   Final    Setup Time [id number]   Final    Colony Count NO GROWTH   Final    Culture NO GROWTH   Final    Report Status 10/20/2011 FINAL   Final   URINE CULTURE     Status: Normal   Collection Time   10/25/11  8:46 PM      Component Value Range Status Comment   Specimen Description URINE, CLEAN CATCH   Final    Special Requests NONE   Final    Setup Time [id number]   Final    Colony Count >=100,000 COLONIES/ML   Final    Culture ESCHERICHIA COLI   Final    Report Status 10/27/2011 FINAL   Final    Organism ID, Bacteria ESCHERICHIA COLI   Final   MRSA PCR SCREENING     Status: Normal   Collection Time   10/25/11 11:37 PM      Component Value Range Status Comment   MRSA by PCR NEGATIVE  NEGATIVE  Final   MRSA PCR SCREENING     Status: Normal   Collection Time   11/03/11 10:32 PM      Component Value Range Status Comment   MRSA by PCR NEGATIVE  NEGATIVE  Final   CULTURE, BLOOD (ROUTINE X 2)     Status: Normal (Preliminary result)   Collection Time   11/04/11  1:46 PM      Component Value Range Status Comment   Specimen Description BLOOD ARM RIGHT   Final    Special Requests BOTTLES DRAWN AEROBIC AND ANAEROBIC 10CC EACH   Final    Setup Time [id number]   Final    Culture     Final    Value:        BLOOD CULTURE RECEIVED NO GROWTH TO DATE CULTURE WILL BE HELD FOR 5 DAYS BEFORE ISSUING A FINAL NEGATIVE REPORT   Report Status PENDING   Incomplete   CULTURE, BLOOD (ROUTINE X 2)     Status: Normal (Preliminary result)   Collection Time   11/04/11  2:00 PM      Component Value Range Status Comment   Specimen Description BLOOD HAND RIGHT   Final    Special Requests BOTTLES DRAWN AEROBIC ONLY 10CC BLUE   Final    Setup Time  [id number]   Final    Culture     Final    Value:        BLOOD CULTURE RECEIVED NO GROWTH TO DATE CULTURE WILL BE HELD FOR 5 DAYS BEFORE ISSUING A FINAL NEGATIVE REPORT   Report Status PENDING   Incomplete     Medical History: Past Medical History  Diagnosis Date  . Diabetes mellitus   . Hypertension   . CHF (congestive heart failure)   . CAD (coronary artery disease)   . Pacemaker   . TIA (transient ischemic attack)   . Stroke   . Hyperlipidemia   . Spinal stenosis   . DJD (degenerative joint disease)   . Peripheral vascular disease   . Hemorrhoids   . Carotid artery occlusion   . Dysphagia S/P CVA (cerebrovascular accident)     Medications:  Prescriptions prior to admission  Medication Sig Dispense Refill  . aspirin 81 MG chewable tablet Chew 81 mg by mouth daily.       Marland Kitchen atorvastatin (LIPITOR) 20 MG tablet Take 20 mg by mouth at bedtime.       . baclofen (LIORESAL) 10 MG tablet Take 1 tablet (10 mg total) by  mouth 3 (three) times daily.  30 each    . carvedilol (COREG) 12.5 MG tablet Take 12.5 mg by mouth 2 (two) times daily with a meal.        . cefUROXime (CEFTIN) 500 MG tablet Take 1 tablet (500 mg total) by mouth 2 (two) times daily with a meal.  8 tablet  0  . clopidogrel (PLAVIX) 75 MG tablet Take 75 mg by mouth daily.       Marland Kitchen docusate sodium 100 MG CAPS Take 100 mg by mouth 2 (two) times daily.  10 capsule    . finasteride (PROSCAR) 5 MG tablet Take 5 mg by mouth at bedtime.       . insulin aspart (NOVOLOG) 100 UNIT/ML injection Inject 1-9 Units into the skin 3 (three) times daily with meals. Per sliding scale      . insulin glargine (LANTUS) 100 UNIT/ML injection Inject 15 Units into the skin at bedtime.        Marland Kitchen levofloxacin (LEVAQUIN) 500 MG tablet Take 500 mg by mouth daily. 7 day course. Started on 10/30/11      . lisinopril (PRINIVIL,ZESTRIL) 5 MG tablet Take 5 mg by mouth daily.        . metaxalone (SKELAXIN) 800 MG tablet Take 800 mg by mouth 3 (three) times daily as needed. For muscle spasms      . mirtazapine (REMERON) 7.5 MG tablet Take 7.5 mg by mouth at bedtime.       . Multiple Vitamins-Minerals (ICAPS) CAPS Take 1 capsule by mouth daily.       Marland Kitchen oxyCODONE-acetaminophen (PERCOCET) 5-325 MG per tablet Take 2 tablets by mouth every 4 (four) hours as needed. For pain      . protein supplement (PROSOURCE NO CARB) LIQD Take 30 mLs by mouth 2 (two) times daily.        Marland Kitchen spironolactone (ALDACTONE) 25 MG tablet Take 25 mg by mouth daily.        . Tamsulosin HCl (FLOMAX) 0.4 MG CAPS Take 0.4 mg by mouth at bedtime.       Marland Kitchen zinc sulfate 220 MG capsule Take 220 mg by mouth every evening.       . Nutritional Supplements (BOOST GLUCOSE CONTROL) LIQD Take 1 Can by mouth 3 (  three) times daily with meals.        . Sennosides (SENOKOT PO) Take 2 tablets by mouth daily as needed. For constipation       Assessment: 77 yo male on vancomycin and zosyn for possible HCAP.  Patient was on Vancomycin  and Zosyn from 11/10-11/14.  Today fever spiked to 101.1 and wbc up to 14.8.   Goal of Therapy:  Vancomycin trough level 15-20 mcg/ml  Plan:  Start  Vancomycin 750mg  iv q12hrs and Zosyn 3.375 gm iv q8hrs(4 hr infusion).  Follow-up renal function and microdata.  Marylouise Stacks 11/09/2011,1:48 PM Pager [phone]

## 2011-11-09 NOTE — Plan of Care (Signed)
Problem: Phase II Progression Outcomes Goal: Other Phase II Outcomes/Goals Outcome: Not Progressing Decreased alertness today for po intake with SLP

## 2011-11-09 NOTE — Progress Notes (Signed)
Subjective: Pt is very lethargic, responding to simple commands. As per the nurse and speech and swallow team, pt does not have gag reflex. Over night pt had few beats of V TACH, pt has a pacemeaker, probably malfunctioning.  Pt had a SBP  Of 190, and was given iv hydralazine overnight, his bp dropped to 107/50, was given coreg and lisinopril and pt blood pressure dropped further to 70/31mmhg.  Objective: Vital signs in last 24 hours: Filed Vitals:   11/08/11 2241 11/09/11 0155 11/09/11 0426 11/09/11 0908  BP: 190/50 183/91 146/51 107/64  Pulse:   75   Temp:      TempSrc:   Oral   Resp:   18   Height:      Weight:      SpO2:   93%    Weight change:   Intake/Output Summary (Last 24 hours) at 11/09/11 1020 Last data filed at 11/09/11 0900  Gross per 24 hour  Intake    205 ml  Output    500 ml  Net   -295 ml   Physical Exam:   General Appearance:    Lethargic, arousable not in any distress.   Lungs:    decreased at bases.    Heart:    Regular rate and rhythm,   Abdomen:     Soft, non-tender, bowel sounds active all four quadrants,      Extremities:   Left foot ulcer, bilateral feet  In boots.    Neurologic:   Lethargic, confused, slurred speech, left hemiparesis, right upper extremity motor strength 3-4/5.       Lab Results: Results for orders placed during the hospital encounter of 11/03/11 (from the past 48 hour(s))  GLUCOSE, CAPILLARY     Status: Abnormal   Collection Time   11/07/11  1:24 PM      Component Value Range Comment   Glucose-Capillary 156 (*) 70 - 99 (mg/dL)   GLUCOSE, CAPILLARY     Status: Abnormal   Collection Time   11/07/11  4:16 PM      Component Value Range Comment   Glucose-Capillary 180 (*) 70 - 99 (mg/dL)   GLUCOSE, CAPILLARY     Status: Abnormal   Collection Time   11/07/11  6:34 PM      Component Value Range Comment   Glucose-Capillary 143 (*) 70 - 99 (mg/dL)   GLUCOSE, CAPILLARY     Status: Abnormal   Collection Time   11/07/11   8:28 PM      Component Value Range Comment   Glucose-Capillary 182 (*) 70 - 99 (mg/dL)   GLUCOSE, CAPILLARY     Status: Abnormal   Collection Time   11/07/11 11:40 PM      Component Value Range Comment   Glucose-Capillary 155 (*) 70 - 99 (mg/dL)   GLUCOSE, CAPILLARY     Status: Abnormal   Collection Time   11/08/11  4:16 AM      Component Value Range Comment   Glucose-Capillary 285 (*) 70 - 99 (mg/dL)   GLUCOSE, CAPILLARY     Status: Abnormal   Collection Time   11/08/11  7:49 AM      Component Value Range Comment   Glucose-Capillary 62 (*) 70 - 99 (mg/dL)    Comment 1 Documented in Chart     GLUCOSE, CAPILLARY     Status: Abnormal   Collection Time   11/08/11  8:30 AM      Component Value Range Comment   Glucose-Capillary  101 (*) 70 - 99 (mg/dL)    Comment 1 Documented in Chart     GLUCOSE, CAPILLARY     Status: Abnormal   Collection Time   11/08/11 11:47 AM      Component Value Range Comment   Glucose-Capillary 121 (*) 70 - 99 (mg/dL)    Comment 1 Documented in Chart     GLUCOSE, CAPILLARY     Status: Abnormal   Collection Time   11/08/11  4:25 PM      Component Value Range Comment   Glucose-Capillary 162 (*) 70 - 99 (mg/dL)   GLUCOSE, CAPILLARY     Status: Abnormal   Collection Time   11/08/11  8:06 PM      Component Value Range Comment   Glucose-Capillary 181 (*) 70 - 99 (mg/dL)   GLUCOSE, CAPILLARY     Status: Abnormal   Collection Time   11/09/11  7:23 AM      Component Value Range Comment   Glucose-Capillary 164 (*) 70 - 99 (mg/dL)     Micro Results: Recent Results (from the past 240 hour(s))  MRSA PCR SCREENING     Status: Normal   Collection Time   11/03/11 10:32 PM      Component Value Range Status Comment   MRSA by PCR NEGATIVE  NEGATIVE  Final   CULTURE, BLOOD (ROUTINE X 2)     Status: Normal (Preliminary result)   Collection Time   11/04/11  1:46 PM      Component Value Range Status Comment   Specimen Description BLOOD ARM RIGHT   Final     Special Requests BOTTLES DRAWN AEROBIC AND ANAEROBIC 10CC EACH   Final    Setup Time [id number]   Final    Culture     Final    Value:        BLOOD CULTURE RECEIVED NO GROWTH TO DATE CULTURE WILL BE HELD FOR 5 DAYS BEFORE ISSUING A FINAL NEGATIVE REPORT   Report Status PENDING   Incomplete   CULTURE, BLOOD (ROUTINE X 2)     Status: Normal (Preliminary result)   Collection Time   11/04/11  2:00 PM      Component Value Range Status Comment   Specimen Description BLOOD HAND RIGHT   Final    Special Requests BOTTLES DRAWN AEROBIC ONLY 10CC BLUE   Final    Setup Time [id number]   Final    Culture     Final    Value:        BLOOD CULTURE RECEIVED NO GROWTH TO DATE CULTURE WILL BE HELD FOR 5 DAYS BEFORE ISSUING A FINAL NEGATIVE REPORT   Report Status PENDING   Incomplete    Studies/Results: No results found. Medications:  Reviewed. Scheduled Meds:   . carvedilol  3.125 mg Oral BID WC  . clopidogrel  75 mg Oral Q breakfast  . collagenase   Topical Daily  . doxycycline  100 mg Oral Q12H  . feeding supplement  237 mL Oral TID WC  . fentaNYL  50 mcg Transdermal Q72H  . gabapentin  300 mg Oral TID  . heparin  5,000 Units Subcutaneous Q8H  . insulin aspart  0-5 Units Subcutaneous QHS  . insulin aspart  0-9 Units Subcutaneous TID WC  . levETIRAcetam  500 mg Oral BID  . lisinopril  2.5 mg Oral Daily  . Tamsulosin HCl  0.4 mg Oral QHS  . DISCONTD: insulin aspart  0-9 Units Subcutaneous Q4H   Continuous  Infusions:  PRN Meds:.acetaminophen, acetaminophen, albuterol, bisacodyl, hydrALAZINE, metoprolol, morphine injection, ondansetron (ZOFRAN) IV, ondansetron Assessment/Plan: Principal Problem:  Metabolic/ Toxic Encephalopathy.: initially thought to be  secondary to drug overdose ( morphine) , has a h/o cva with left hemiparesis, ruled out Underlying seizure activity with a negative EEG, on a therapeutic trial of keppra currently.  This am pt became lethargic, but arousable, and RN noticed  the pt did not have gag reflex, he was febrile, hypotensive and hypoxic, . Neurology team at bedside, no further neurological work up from their perspective.  Will try to rule out aspiration pneumonia. Ordered stat cxr, blood cultures, ua, cbc, CMP. Will also get an ABG, put the pt on oxygen as needed. Will get a ct head without contrast.   Active Problems:  Atherosclerosis of native arteries of the extremities with ulceration: reduced arterial flow bilateral lower extremities. otupatient work up.   Pt has an appointment as outpatient with vascular surgery.   CVA, old, hemiparesis on plavix.   CHF, left ventricular Ef 40-45 %:  On coreg and lisinopril. Currently will hold them for hypotension, can be restarted back when appropriate.   Complete heart block on Artificial pacemaker  DM type 2 causing CKD stage 3   URI : ON DOXYCYCLINE till 17th of November.  .  DVT prophylaxis - Heparin  Disposition:spoke to the daughter Ms Erie Noe about advanced directives, and code status, as per the conversation, she and the pt doesn't want to be on  artificial life support, no intubation, but she is not sure about CPR, would like to speak to palliative care services about Goals of Care.    LOS: 6 days   Virginia Curl 11/09/2011, 10:20 AM

## 2011-11-09 NOTE — Significant Event (Signed)
Rapid Response Event Note  Overview: Time Called: 1000 Arrival Time: 1002 Event Type: Neurologic;Hypotension  Initial Focused Assessment: Called to patient's bedside because sudden change in neuro status.  RN and neuro PA at bedside upon arrival. Patient with decreased LOC, pt neuro status variable, unresponsive then able to follow some commands and answer some questions.  BP initially 94/56 with HR 70s paced and O2 sats 89% on RA. Blood pressure rechecked BP 78/47, HR 87 Lung sounds clear  Interventions: NS bolus initiated per protocol.  MD notified of BP change, orders received for 500cc NS bolus. Placed pt on 100% NRB initially, sats 100% Weaned o2 to 6L [place]  O2 sats 94%,  Will continue to wean O2 as tolerated. Patient became more responsive.  And returned to his neurological baseline. Per MD palliative care consult after conversation with family member. See Doc Flowsheets for detailed VS   Event Summary: Name of Physician Notified: AKula at 1000    at    Outcome: Stayed in room and stabalized;Code status clarified  Event End Time: 1129  Erik Obey

## 2011-11-10 ENCOUNTER — Encounter (HOSPITAL_COMMUNITY): Payer: Self-pay | Admitting: Internal Medicine

## 2011-11-10 LAB — GLUCOSE, CAPILLARY: Glucose-Capillary: 126 mg/dL — ABNORMAL HIGH (ref 70–99)

## 2011-11-10 LAB — CBC
Hemoglobin: 9 g/dL — ABNORMAL LOW (ref 13.0–17.0)
MCH: 28.6 pg (ref 26.0–34.0)
MCV: 88.9 fL (ref 78.0–100.0)
RBC: 3.15 MIL/uL — ABNORMAL LOW (ref 4.22–5.81)

## 2011-11-10 LAB — CULTURE, BLOOD (ROUTINE X 2)
Culture  Setup Time: 201211102137
Culture: NO GROWTH

## 2011-11-10 LAB — URINALYSIS, ROUTINE W REFLEX MICROSCOPIC
Bilirubin Urine: NEGATIVE
Glucose, UA: NEGATIVE mg/dL
Hgb urine dipstick: NEGATIVE
Ketones, ur: NEGATIVE mg/dL
Specific Gravity, Urine: 1.019 (ref 1.005–1.030)
pH: 5 (ref 5.0–8.0)

## 2011-11-10 MED ORDER — SODIUM CHLORIDE 0.9 % IV SOLN
INTRAVENOUS | Status: DC
  Administered 2011-11-10 – 2011-11-14 (×3): via INTRAVENOUS

## 2011-11-10 MED ORDER — PRO-STAT SUGAR FREE PO LIQD
30.0000 mL | Freq: Two times a day (BID) | ORAL | Status: DC
  Administered 2011-11-10 – 2011-11-14 (×7): 30 mL via ORAL
  Filled 2011-11-10 (×11): qty 30

## 2011-11-10 MED ORDER — LEVOFLOXACIN IN D5W 750 MG/150ML IV SOLN
750.0000 mg | INTRAVENOUS | Status: DC
  Administered 2011-11-10 – 2011-11-12 (×2): 750 mg via INTRAVENOUS
  Filled 2011-11-10 (×2): qty 150

## 2011-11-10 NOTE — Progress Notes (Signed)
Subjective: Pt is more alert when compared to yesterday. Responding to simple questions. Has low grade temp. Objective: Vital signs in last 24 hours: Filed Vitals:   11/09/11 2128 11/10/11 0537 11/10/11 0615 11/10/11 1300  BP: 141/64 165/63 136/67 136/63  Pulse: 57 55 67 86  Temp: 98.1 F (36.7 C) 98 F (36.7 C)  99.9 F (37.7 C)  TempSrc: Axillary Oral  Oral  Resp: 18 19  20   Height:      Weight:  76 kg (167 lb 8.8 oz)    SpO2: 100% 100%  99%   Weight change:   Intake/Output Summary (Last 24 hours) at 11/10/11 1511 Last data filed at 11/10/11 1504  Gross per 24 hour  Intake   1396 ml  Output    600 ml  Net    796 ml   Physical Exam:   General Appearance:    Alert, cooperative, not in any distress.  Lungs:     Decreased at bases   Heart:    Regular rate and rhythm, S1 and S2 normal,  Abdomen:     Soft, non-tender, bowel sounds active all four quadrants,      Extremities:   Left foot ulcer and bilateral extremities in boots.         Neurologic:   Confused. But  More alert when compared to yesterday. Left hemiparesis.      Lab Results: Results for orders placed during the hospital encounter of 11/03/11 (from the past 48 hour(s))  GLUCOSE, CAPILLARY     Status: Abnormal   Collection Time   11/08/11  4:25 PM      Component Value Range Comment   Glucose-Capillary 162 (*) 70 - 99 (mg/dL)   GLUCOSE, CAPILLARY     Status: Abnormal   Collection Time   11/08/11  8:06 PM      Component Value Range Comment   Glucose-Capillary 181 (*) 70 - 99 (mg/dL)   GLUCOSE, CAPILLARY     Status: Abnormal   Collection Time   11/09/11  7:23 AM      Component Value Range Comment   Glucose-Capillary 164 (*) 70 - 99 (mg/dL)   CULTURE, BLOOD (ROUTINE X 2)     Status: Normal (Preliminary result)   Collection Time   11/09/11 11:10 AM      Component Value Range Comment   Specimen Description BLOOD LEFT ARM      Special Requests BOTTLES DRAWN AEROBIC AND ANAEROBIC 10CC      Setup Time  [id number]      Culture        Value:        BLOOD CULTURE RECEIVED NO GROWTH TO DATE CULTURE WILL BE HELD FOR 5 DAYS BEFORE ISSUING A FINAL NEGATIVE REPORT   Report Status PENDING     GLUCOSE, CAPILLARY     Status: Abnormal   Collection Time   11/09/11 11:28 AM      Component Value Range Comment   Glucose-Capillary 177 (*) 70 - 99 (mg/dL)   CULTURE, BLOOD (ROUTINE X 2)     Status: Normal (Preliminary result)   Collection Time   11/09/11 11:49 AM      Component Value Range Comment   Specimen Description BLOOD LEFT HAND      Special Requests BOTTLES DRAWN AEROBIC ONLY 3CC      Setup Time [id number]      Culture        Value:        BLOOD CULTURE  RECEIVED NO GROWTH TO DATE CULTURE WILL BE HELD FOR 5 DAYS BEFORE ISSUING A FINAL NEGATIVE REPORT   Report Status PENDING     CBC     Status: Abnormal   Collection Time   11/09/11 11:49 AM      Component Value Range Comment   WBC 14.8 (*) 4.0 - 10.5 (K/uL)    RBC 3.23 (*) 4.22 - 5.81 (MIL/uL)    Hemoglobin 9.2 (*) 13.0 - 17.0 (g/dL)    HCT 16.1 (*) 09.6 - 52.0 (%)    MCV 89.2  78.0 - 100.0 (fL)    MCH 28.5  26.0 - 34.0 (pg)    MCHC 31.9  30.0 - 36.0 (g/dL)    RDW 04.5  40.9 - 81.1 (%)    Platelets 322  150 - 400 (K/uL)   DIFFERENTIAL     Status: Abnormal   Collection Time   11/09/11 11:49 AM      Component Value Range Comment   Neutrophils Relative 73  43 - 77 (%)    Neutro Abs 10.8 (*) 1.7 - 7.7 (K/uL)    Lymphocytes Relative 14  12 - 46 (%)    Lymphs Abs 2.1  0.7 - 4.0 (K/uL)    Monocytes Relative 11  3 - 12 (%)    Monocytes Absolute 1.6 (*) 0.1 - 1.0 (K/uL)    Eosinophils Relative 2  0 - 5 (%)    Eosinophils Absolute 0.2  0.0 - 0.7 (K/uL)    Basophils Relative 0  0 - 1 (%)    Basophils Absolute 0.0  0.0 - 0.1 (K/uL)   BASIC METABOLIC PANEL     Status: Abnormal   Collection Time   11/09/11 11:49 AM      Component Value Range Comment   Sodium 141  135 - 145 (mEq/L)    Potassium 4.8  3.5 - 5.1 (mEq/L)    Chloride 106  96  - 112 (mEq/L)    CO2 28  19 - 32 (mEq/L)    Glucose, Bld 170 (*) 70 - 99 (mg/dL)    BUN 20  6 - 23 (mg/dL)    Creatinine, Ser 9.14  0.50 - 1.35 (mg/dL)    Calcium 9.1  8.4 - 10.5 (mg/dL)    GFR calc non Af Amer 48 (*) >90 (mL/min)    GFR calc Af Amer 55 (*) >90 (mL/min)   GLUCOSE, CAPILLARY     Status: Abnormal   Collection Time   11/09/11  4:16 PM      Component Value Range Comment   Glucose-Capillary 247 (*) 70 - 99 (mg/dL)   GLUCOSE, CAPILLARY     Status: Abnormal   Collection Time   11/09/11  7:55 PM      Component Value Range Comment   Glucose-Capillary 187 (*) 70 - 99 (mg/dL)   GLUCOSE, CAPILLARY     Status: Abnormal   Collection Time   11/09/11 10:12 PM      Component Value Range Comment   Glucose-Capillary 276 (*) 70 - 99 (mg/dL)   URINALYSIS, ROUTINE W REFLEX MICROSCOPIC     Status: Abnormal   Collection Time   11/10/11  6:20 AM      Component Value Range Comment   Color, Urine AMBER (*) YELLOW  BIOCHEMICALS MAY BE AFFECTED BY COLOR   Appearance CLEAR  CLEAR     Specific Gravity, Urine 1.019  1.005 - 1.030     pH 5.0  5.0 - 8.0  Glucose, UA NEGATIVE  NEGATIVE (mg/dL)    Hgb urine dipstick NEGATIVE  NEGATIVE     Bilirubin Urine NEGATIVE  NEGATIVE     Ketones, ur NEGATIVE  NEGATIVE (mg/dL)    Protein, ur NEGATIVE  NEGATIVE (mg/dL)    Urobilinogen, UA 0.2  0.0 - 1.0 (mg/dL)    Nitrite NEGATIVE  NEGATIVE     Leukocytes, UA NEGATIVE  NEGATIVE  MICROSCOPIC NOT DONE ON URINES WITH NEGATIVE PROTEIN, BLOOD, LEUKOCYTES, NITRITE, OR GLUCOSE <1000 mg/dL.  GLUCOSE, CAPILLARY     Status: Abnormal   Collection Time   11/10/11  7:45 AM      Component Value Range Comment   Glucose-Capillary 126 (*) 70 - 99 (mg/dL)   GLUCOSE, CAPILLARY     Status: Abnormal   Collection Time   11/10/11 12:03 PM      Component Value Range Comment   Glucose-Capillary 259 (*) 70 - 99 (mg/dL)     Micro Results: Recent Results (from the past 240 hour(s))  MRSA PCR SCREENING     Status: Normal    Collection Time   11/03/11 10:32 PM      Component Value Range Status Comment   MRSA by PCR NEGATIVE  NEGATIVE  Final   CULTURE, BLOOD (ROUTINE X 2)     Status: Normal   Collection Time   11/04/11  1:46 PM      Component Value Range Status Comment   Specimen Description BLOOD ARM RIGHT   Final    Special Requests BOTTLES DRAWN AEROBIC AND ANAEROBIC 10CC EACH   Final    Setup Time [id number]   Final    Culture NO GROWTH 5 DAYS   Final    Report Status 11/10/2011 FINAL   Final   CULTURE, BLOOD (ROUTINE X 2)     Status: Normal   Collection Time   11/04/11  2:00 PM      Component Value Range Status Comment   Specimen Description BLOOD HAND RIGHT   Final    Special Requests BOTTLES DRAWN AEROBIC ONLY 10CC BLUE   Final    Setup Time [id number]   Final    Culture NO GROWTH 5 DAYS   Final    Report Status 11/10/2011 FINAL   Final   CULTURE, BLOOD (ROUTINE X 2)     Status: Normal (Preliminary result)   Collection Time   11/09/11 11:10 AM      Component Value Range Status Comment   Specimen Description BLOOD LEFT ARM   Final    Special Requests BOTTLES DRAWN AEROBIC AND ANAEROBIC 10CC   Final    Setup Time [id number]   Final    Culture     Final    Value:        BLOOD CULTURE RECEIVED NO GROWTH TO DATE CULTURE WILL BE HELD FOR 5 DAYS BEFORE ISSUING A FINAL NEGATIVE REPORT   Report Status PENDING   Incomplete   CULTURE, BLOOD (ROUTINE X 2)     Status: Normal (Preliminary result)   Collection Time   11/09/11 11:49 AM      Component Value Range Status Comment   Specimen Description BLOOD LEFT HAND   Final    Special Requests BOTTLES DRAWN AEROBIC ONLY 3CC   Final    Setup Time [id number]   Final    Culture     Final    Value:        BLOOD CULTURE RECEIVED NO GROWTH TO DATE CULTURE WILL BE HELD  FOR 5 DAYS BEFORE ISSUING A FINAL NEGATIVE REPORT   Report Status PENDING   Incomplete    Studies/Results: Dg Chest 1 View  11/09/2011  *RADIOLOGY REPORT*  Clinical Data: Fever.   CHEST - 1 VIEW  Comparison: 11/04/2011  Findings: Low lung volumes are seen.  There is increased left retrocardiac opacity with nonvisualization of the left hemidiaphragm.  This may be due to left lower lobe atelectasis or infiltrate.  Mild atelectasis noted at right lung base.  Mild cardiomegaly stable.  Dual lead transvenous pacemaker remains in appropriate position.  IMPRESSION: 1.  Atelectasis versus infiltrate in retrocardiac left lower lobe. 2.  Mild right basilar atelectasis.  Original Report Authenticated By: Danae Orleans, M.D.   Ct Head Wo Contrast  11/09/2011  *RADIOLOGY REPORT*  Clinical Data: Slurred speech.  Altered mental status.  Hypotension  CT HEAD WITHOUT CONTRAST  Technique:  Contiguous axial images were obtained from the base of the skull through the vertex without contrast.  Comparison: CT 11/03/2011  Findings: Generalized atrophy.  Chronic microvascular ischemic change in the white matter.  Chronic lacuna in the right putamen. Chronic infarct right cerebellum.  Chronic ischemia in the internal capsule on the right.  Negative for acute infarct.  Negative for hemorrhage or mass lesion.  Calvarium is intact.  IMPRESSION: Atrophy and chronic ischemic change.  No acute abnormality.  Original Report Authenticated By: Camelia Phenes, M.D.   Medications: reviewed.  Scheduled Meds:   . carvedilol  3.125 mg Oral BID WC  . clopidogrel  75 mg Oral Q breakfast  . collagenase   Topical Daily  . feeding supplement  237 mL Oral TID WC  . feeding supplement  30 mL Oral BID  . gabapentin  300 mg Oral TID  . heparin  5,000 Units Subcutaneous Q8H  . insulin aspart  0-5 Units Subcutaneous QHS  . insulin aspart  0-9 Units Subcutaneous TID WC  . levETIRAcetam  500 mg Oral BID  . lisinopril  2.5 mg Oral Daily  . piperacillin-tazobactam (ZOSYN)  IV  3.375 g Intravenous Q8H  . Tamsulosin HCl  0.4 mg Oral QHS  . vancomycin  750 mg Intravenous Q12H  . DISCONTD: fentaNYL  50 mcg Transdermal Q72H    Continuous Infusions:   . sodium chloride 40 mL/hr at 11/09/11 1130   PRN Meds:.acetaminophen, acetaminophen, albuterol, bisacodyl, hydrALAZINE, metoprolol, morphine injection, ondansetron (ZOFRAN) IV, ondansetron Assessment/Plan:  LOS: 7 days       Metabolic/ Toxic Encephalopathy.: initially thought to be secondary to drug overdose ( morphine) , has a h/o cva with left hemiparesis, ruled out Underlying seizure activity with a negative EEG, on a therapeutic trial of keppra currently. yesterday pt became lethargic, but arousable, and RN noticed the pt did not have gag reflex, he was febrile, hypotensive and hypoxic, . Neurology team at bedside, no further neurological work up from their perspective.  cxr showed possible pneumonia, is being treated for HCAP. On iv antibiotics. Pt is more alert when compared to yesterday. Still running a low grade fever, will add levaquin for atypical coverage.   Appreciate Dr taylor's input. Will continue to monitor pt off fentanyl.  Active Problems:  Atherosclerosis of native arteries of the extremities with ulceration: reduced arterial flow bilateral lower extremities. otupatient work up.  Pt has an appointment as outpatient with vascular surgery.  CVA, old, hemiparesis on plavix.  CHF, left ventricular Ef 40-45 %: On coreg and lisinopril. Currently will hold them for hypotension, can be restarted  back when appropriate.  Complete heart block on Artificial pacemaker  DM type 2 causing CKD stage 3   .  DVT prophylaxis - Heparin  Disposition:spoke to the daughter Ms Erie Noe about advanced directives, and code status, as per the conversation, she and the pt doesn't want to be on artificial life support,currently pt is DNI .    Giara Mcgaughey 11/10/2011, 3:11 PM

## 2011-11-10 NOTE — Plan of Care (Signed)
Problem: Phase I Progression Outcomes Goal: OOB as tolerated unless otherwise ordered Outcome: Progressing PT working with pt, 1 assist now.  Possible d/c to SNF for rehab.  Pt was up on her own at home using walker.

## 2011-11-10 NOTE — Progress Notes (Signed)
11/09/11  2230  Insulin orders had HS coverage but then there was a written order for no HS coverage. MD was called to clarify. MD said to give insulin according to HS scale. Pt CBG was 276- received 3 units.  Mechele Collin

## 2011-11-10 NOTE — Progress Notes (Signed)
Speech Pathology: Dysphagia Treatment Note  Patient was observed with : Pureed and Thin liquids.  Patient was noted to have s/s of aspiration : No:    Lung Sounds: decreased with some wet rhonchi Temperature: 98.8  Patient required: min cues to consistently follow precautions/strategies  Other: Patient more alert today, agreeable to pos however refused solids (puree) following only one bite. Delayed A-P transit noted with mild-moderate bilateral buccal pocketing and mild anterior pitting of bolus. When questioned about residue, patient stated, "Ill get that cleared out eventually".  Oral care complete to remove residuals. Although no overt s/s of aspiration observed however laryngeal elevation noted to be moderately sluggish and given generally weakness, aspiration risk remains present. Additionally, not questionable LLL infiltrate on CXR.  Recommendations:  Continue current diet (puree, thin liquid) with aspiration precautions. MD, if feel necessary at this time to r/o aspiration, please order MBS. Otherwise, will f/u at bedside for now.   Pain:   none Intervention Required:   No   Goals:  (modified)  1. Patient will consume puree solids with thin liquids with no overt s/s of aspiration and min assist for use of compensatory strategies and aspiration precautions.   Ferdinand Lango MA, CCC-SLP [phone] Name change in 04/2011. Correct on license as Ferdinand Lango MA, CCC-SLP

## 2011-11-10 NOTE — Plan of Care (Signed)
Problem: Phase I Progression Outcomes Goal: Initial discharge plan identified Outcome: Progressing Possible discharge to SNF for short term rehab.  Pt mobility not back to her norm yet.

## 2011-11-10 NOTE — Progress Notes (Signed)
Inpatient Diabetes Program Recommendations  AACE/ADA: New Consensus Statement on Inpatient Glycemic Control (2009)  Target Ranges:  Prepandial:   less than 140 mg/dL      Peak postprandial:   less than 180 mg/dL (1-2 hours)      Critically ill patients:  140 - 180 mg/dL   Reason for Visit:  CBGs 11/15: 164/ 177/ 247/ 276 mg/dl  Inpatient Diabetes Program Recommendations Correction (SSI): Please increase Novolog correction scale to Moderate scale tid ac + HS  Note: Please increase to Moderate scale (SSI) if CBG's stay elevated above 180 mg/dl

## 2011-11-10 NOTE — Progress Notes (Signed)
ANTIBIOTIC CONSULT NOTE - INITIAL  Pharmacy Consult for Levaquin Indication: possible HCAP; adding double gram (-) coverage  No Known Allergies  Patient Measurements: Height: 5\' 10"  (177.8 cm) Weight: 167 lb 8.8 oz (76 kg) IBW/kg (Calculated) : 73    Vital Signs: Temp: 99.9 F (37.7 C) (11/16 1300) Temp src: Oral (11/16 1300) BP: 136/63 mmHg (11/16 1300) Pulse Rate: 86  (11/16 1300) Intake/Output from previous day: 11/15 0701 - 11/16 0700 In: 2056 [P.O.:466; I.V.:1240; IV Piggyback:350] Out: 275 [Urine:275] Intake/Output from this shift: Total I/O In: 360 [I.V.:160; IV Piggyback:200] Out: 500 [Urine:500]  Labs:  [facility] 11/09/11 1149  WBC 14.8*  HGB 9.2*  PLT 322  LABCREA --  CREATININE 1.34   Estimated Creatinine Clearance: 43.9 ml/min (by C-G formula based on Cr of 1.34). No results found for this basename: VANCOTROUGH:2,VANCOPEAK:2,VANCORANDOM:2,GENTTROUGH:2,GENTPEAK:2,GENTRANDOM:2,TOBRATROUGH:2,TOBRAPEAK:2,TOBRARND:2,AMIKACINPEAK:2,AMIKACINTROU:2,AMIKACIN:2, in the last 72 hours   Microbiology: Recent Results (from the past 720 hour(s))  URINE CULTURE     Status: Normal   Collection Time   10/19/11  3:32 PM      Component Value Range Status Comment   Specimen Description URINE, RANDOM   Final    Special Requests NONE   Final    Setup Time [id number]   Final    Colony Count NO GROWTH   Final    Culture NO GROWTH   Final    Report Status 10/20/2011 FINAL   Final   URINE CULTURE     Status: Normal   Collection Time   10/25/11  8:46 PM      Component Value Range Status Comment   Specimen Description URINE, CLEAN CATCH   Final    Special Requests NONE   Final    Setup Time [id number]   Final    Colony Count >=100,000 COLONIES/ML   Final    Culture ESCHERICHIA COLI   Final    Report Status 10/27/2011 FINAL   Final    Organism ID, Bacteria ESCHERICHIA COLI   Final   MRSA PCR SCREENING     Status: Normal   Collection Time   10/25/11 11:37 PM   Component Value Range Status Comment   MRSA by PCR NEGATIVE  NEGATIVE  Final   MRSA PCR SCREENING     Status: Normal   Collection Time   11/03/11 10:32 PM      Component Value Range Status Comment   MRSA by PCR NEGATIVE  NEGATIVE  Final   CULTURE, BLOOD (ROUTINE X 2)     Status: Normal   Collection Time   11/04/11  1:46 PM      Component Value Range Status Comment   Specimen Description BLOOD ARM RIGHT   Final    Special Requests BOTTLES DRAWN AEROBIC AND ANAEROBIC 10CC EACH   Final    Setup Time [id number]   Final    Culture NO GROWTH 5 DAYS   Final    Report Status 11/10/2011 FINAL   Final   CULTURE, BLOOD (ROUTINE X 2)     Status: Normal   Collection Time   11/04/11  2:00 PM      Component Value Range Status Comment   Specimen Description BLOOD HAND RIGHT   Final    Special Requests BOTTLES DRAWN AEROBIC ONLY 10CC BLUE   Final    Setup Time [id number]   Final    Culture NO GROWTH 5 DAYS   Final    Report Status 11/10/2011 FINAL   Final   CULTURE, BLOOD (ROUTINE X  2)     Status: Normal (Preliminary result)   Collection Time   11/09/11 11:10 AM      Component Value Range Status Comment   Specimen Description BLOOD LEFT ARM   Final    Special Requests BOTTLES DRAWN AEROBIC AND ANAEROBIC 10CC   Final    Setup Time [id number]   Final    Culture     Final    Value:        BLOOD CULTURE RECEIVED NO GROWTH TO DATE CULTURE WILL BE HELD FOR 5 DAYS BEFORE ISSUING A FINAL NEGATIVE REPORT   Report Status PENDING   Incomplete   CULTURE, BLOOD (ROUTINE X 2)     Status: Normal (Preliminary result)   Collection Time   11/09/11 11:49 AM      Component Value Range Status Comment   Specimen Description BLOOD LEFT HAND   Final    Special Requests BOTTLES DRAWN AEROBIC ONLY 3CC   Final    Setup Time [id number]   Final    Culture     Final    Value:        BLOOD CULTURE RECEIVED NO GROWTH TO DATE CULTURE WILL BE HELD FOR 5 DAYS BEFORE ISSUING A FINAL NEGATIVE REPORT   Report Status  PENDING   Incomplete     Medical History: Past Medical History  Diagnosis Date  . Diabetes mellitus   . Hypertension   . CHF (congestive heart failure)   . CAD (coronary artery disease)   . Pacemaker   . TIA (transient ischemic attack)   . Stroke   . Hyperlipidemia   . Spinal stenosis   . DJD (degenerative joint disease)   . Peripheral vascular disease   . Hemorrhoids   . Carotid artery occlusion   . Dysphagia S/P CVA (cerebrovascular accident)     Medications:  On Vancomycin and Zosyn day # 2 for HCAP  Assessment: 77 yo male known to [place] for vancomycin and Zosyn dosing now to add double coverage with Levaquin.  Tm 101.1, wbc 14.8. CrCl~43.9 ml/min. Blood cultures pending.   Goal of Therapy:  Resolution of signs and symptoms of infection.   Plan:  1. Levaquin 750mg  IV q48h-1st dose today.  2. Will follow-up cultures and renal function.   Fayne Norrie 11/10/2011,3:26 PM

## 2011-11-10 NOTE — Plan of Care (Signed)
Problem: Phase II Progression Outcomes Goal: Other Phase II Outcomes/Goals Outcome: Progressing Tolerating diet     

## 2011-11-10 NOTE — Progress Notes (Addendum)
Nutrition Follow-Up  Pt continues on a Dysphagia 1 - thin liquid diet. Noted speech dysphagia treatment note 11/16 -- SLP recommending continuing with current diet. PO intake remains very poor at 0-20% per records. Pt receiving Ensure Clinical Strength TID & Prostat liquid protein BID. Per Nurse Tech, pt drank 1 Ensure today. Noted Palliative Medicine Team involvement.  Weight stable at 76 kg (11/16)  Labs 11/15:  Sodium 141 mEq/L      Potassium 4.8 mEq/L      Chloride 106 mEq/L      CO2 28 mEq/L      Glucose, Bld 170 mg/dL H     BUN 20 mg/dL      Creatinine, Ser 1.19 mg/dL      Calcium 9.1 mg/dL  I/O last 3 completed shifts: In: 2056 [P.O.:466; I.V.:1240; IV Piggyback:350] Out: 775 [Urine:775] Total I/O In: 210 [I.V.:160; IV Piggyback:50] Out: -   Nutrition Dx: Inadequate Oral Intake, ongoing. Goal: pt to meet >90% of estimated nutrition needs, unmet.  Plan:  Continue Ensure Clinical Strength TID, Prostat liquid protein BID  RD to follow for nutrition care plan  Kirkland Hun, RD, LDN Pager #: [phone]

## 2011-11-10 NOTE — Progress Notes (Signed)
11/10/2011 Palliative Medicine Team SW 2:14 PM  Referred in PMT Rounds to provide psychosocial support to pt's daughter [name]. Spoke with dtr by phone at [phone] to offer emotional support as she reports several stressors on top of tough decisions regarding pt's care. Dtr very appreciative of support, unable to have extensive conversation at present, but has my pager number to reach out as additional needs arise.   [name], [place]  Pager [phone]

## 2011-11-10 NOTE — Progress Notes (Signed)
Patient is not ready to dc. CSW will continue to follow and will assist with dc needs.

## 2011-11-11 LAB — BASIC METABOLIC PANEL
CO2: 27 mEq/L (ref 19–32)
Calcium: 8.1 mg/dL — ABNORMAL LOW (ref 8.4–10.5)
Glucose, Bld: 145 mg/dL — ABNORMAL HIGH (ref 70–99)
Sodium: 143 mEq/L (ref 135–145)

## 2011-11-11 LAB — CBC
HCT: 24.4 % — ABNORMAL LOW (ref 39.0–52.0)
HCT: 25.3 % — ABNORMAL LOW (ref 39.0–52.0)
MCH: 28.8 pg (ref 26.0–34.0)
MCHC: 31.6 g/dL (ref 30.0–36.0)
MCV: 90 fL (ref 78.0–100.0)
Platelets: 257 10*3/uL (ref 150–400)
RBC: 2.71 MIL/uL — ABNORMAL LOW (ref 4.22–5.81)
WBC: 13.7 10*3/uL — ABNORMAL HIGH (ref 4.0–10.5)

## 2011-11-11 LAB — GLUCOSE, CAPILLARY
Glucose-Capillary: 144 mg/dL — ABNORMAL HIGH (ref 70–99)
Glucose-Capillary: 219 mg/dL — ABNORMAL HIGH (ref 70–99)

## 2011-11-11 LAB — VANCOMYCIN, TROUGH: Vancomycin Tr: 26.9 ug/mL (ref 10.0–20.0)

## 2011-11-11 NOTE — Progress Notes (Signed)
Subjective: Communicating appropriately. And mental status at baseline.  Objective: Vital signs in last 24 hours: Filed Vitals:   11/10/11 0615 11/10/11 1300 11/10/11 2100 11/11/11 0500  BP: 136/67 136/63 157/74 138/71  Pulse: 67 86 78 68  Temp:  99.9 F (37.7 C) 99.7 F (37.6 C) 98.3 F (36.8 C)  TempSrc:  Oral Oral Oral  Resp:  20 20 22   Height:      Weight:      SpO2:  99% 100% 100%   Weight change:   Intake/Output Summary (Last 24 hours) at 11/11/11 1443 Last data filed at 11/11/11 1414  Gross per 24 hour  Intake 1354.67 ml  Output    476 ml  Net 878.67 ml   Physical Exam:   General Appearance:    Alert, cooperative, no distress, appears stated age  Lungs:     Decreased at bases, no wheezing or rhonchi   Heart:    Regular rate and rhythm, S1 and S2 normal,  Abdomen:     Soft, non-tender, bowel sounds active all four quadrants,    no masses, no organomegaly  Extremities:   Left foot ulcer, bilateral feet in boots., no cyanosis or edema        Neurologic:   Left hemiparesis     Lab Results: Results for orders placed during the hospital encounter of 11/03/11 (from the past 24 hour(s))  CBC     Status: Abnormal   Collection Time   11/10/11  3:32 PM      Component Value Range   WBC 15.0 (*) 4.0 - 10.5 (K/uL)   RBC 3.15 (*) 4.22 - 5.81 (MIL/uL)   Hemoglobin 9.0 (*) 13.0 - 17.0 (g/dL)   HCT 16.1 (*) 09.6 - 52.0 (%)   MCV 88.9  78.0 - 100.0 (fL)   MCH 28.6  26.0 - 34.0 (pg)   MCHC 32.1  30.0 - 36.0 (g/dL)   RDW 04.5  40.9 - 81.1 (%)   Platelets 290  150 - 400 (K/uL)  GLUCOSE, CAPILLARY     Status: Abnormal   Collection Time   11/10/11  4:49 PM      Component Value Range   Glucose-Capillary 203 (*) 70 - 99 (mg/dL)  GLUCOSE, CAPILLARY     Status: Abnormal   Collection Time   11/10/11 10:23 PM      Component Value Range   Glucose-Capillary 191 (*) 70 - 99 (mg/dL)   Comment 1 Notify RN     Comment 2 Documented in Chart    BASIC METABOLIC PANEL     Status:  Abnormal   Collection Time   11/11/11  6:00 AM      Component Value Range   Sodium 143  135 - 145 (mEq/L)   Potassium 4.6  3.5 - 5.1 (mEq/L)   Chloride 109  96 - 112 (mEq/L)   CO2 27  19 - 32 (mEq/L)   Glucose, Bld 145 (*) 70 - 99 (mg/dL)   BUN 21  6 - 23 (mg/dL)   Creatinine, Ser 9.14  0.50 - 1.35 (mg/dL)   Calcium 8.1 (*) 8.4 - 10.5 (mg/dL)   GFR calc non Af Amer 49 (*) >90 (mL/min)   GFR calc Af Amer 57 (*) >90 (mL/min)  CBC     Status: Abnormal   Collection Time   11/11/11  6:00 AM      Component Value Range   WBC 12.7 (*) 4.0 - 10.5 (K/uL)   RBC 2.71 (*) 4.22 -  5.81 (MIL/uL)   Hemoglobin 7.8 (*) 13.0 - 17.0 (g/dL)   HCT 40.9 (*) 81.1 - 52.0 (%)   MCV 90.0  78.0 - 100.0 (fL)   MCH 28.8  26.0 - 34.0 (pg)   MCHC 32.0  30.0 - 36.0 (g/dL)   RDW 91.4  78.2 - 95.6 (%)   Platelets 257  150 - 400 (K/uL)  GLUCOSE, CAPILLARY     Status: Abnormal   Collection Time   11/11/11  7:53 AM      Component Value Range   Glucose-Capillary 144 (*) 70 - 99 (mg/dL)  GLUCOSE, CAPILLARY     Status: Abnormal   Collection Time   11/11/11 11:59 AM      Component Value Range   Glucose-Capillary 219 (*) 70 - 99 (mg/dL)    Micro Results: Recent Results (from the past 240 hour(s))  MRSA PCR SCREENING     Status: Normal   Collection Time   11/03/11 10:32 PM      Component Value Range Status Comment   MRSA by PCR NEGATIVE  NEGATIVE  Final   CULTURE, BLOOD (ROUTINE X 2)     Status: Normal   Collection Time   11/04/11  1:46 PM      Component Value Range Status Comment   Specimen Description BLOOD ARM RIGHT   Final    Special Requests BOTTLES DRAWN AEROBIC AND ANAEROBIC 10CC EACH   Final    Setup Time [id number]   Final    Culture NO GROWTH 5 DAYS   Final    Report Status 11/10/2011 FINAL   Final   CULTURE, BLOOD (ROUTINE X 2)     Status: Normal   Collection Time   11/04/11  2:00 PM      Component Value Range Status Comment   Specimen Description BLOOD HAND RIGHT   Final    Special  Requests BOTTLES DRAWN AEROBIC ONLY 10CC BLUE   Final    Setup Time [id number]   Final    Culture NO GROWTH 5 DAYS   Final    Report Status 11/10/2011 FINAL   Final   CULTURE, BLOOD (ROUTINE X 2)     Status: Normal (Preliminary result)   Collection Time   11/09/11 11:10 AM      Component Value Range Status Comment   Specimen Description BLOOD LEFT ARM   Final    Special Requests BOTTLES DRAWN AEROBIC AND ANAEROBIC 10CC   Final    Setup Time [id number]   Final    Culture     Final    Value:        BLOOD CULTURE RECEIVED NO GROWTH TO DATE CULTURE WILL BE HELD FOR 5 DAYS BEFORE ISSUING A FINAL NEGATIVE REPORT   Report Status PENDING   Incomplete   CULTURE, BLOOD (ROUTINE X 2)     Status: Normal (Preliminary result)   Collection Time   11/09/11 11:49 AM      Component Value Range Status Comment   Specimen Description BLOOD LEFT HAND   Final    Special Requests BOTTLES DRAWN AEROBIC ONLY 3CC   Final    Setup Time [id number]   Final    Culture     Final    Value:        BLOOD CULTURE RECEIVED NO GROWTH TO DATE CULTURE WILL BE HELD FOR 5 DAYS BEFORE ISSUING A FINAL NEGATIVE REPORT   Report Status PENDING   Incomplete    Studies/Results: No results found. Medications:  reviewed. Scheduled Meds:   . carvedilol  3.125 mg Oral BID WC  . clopidogrel  75 mg Oral Q breakfast  . collagenase   Topical Daily  . feeding supplement  237 mL Oral TID WC  . feeding supplement  30 mL Oral BID  . gabapentin  300 mg Oral TID  . heparin  5,000 Units Subcutaneous Q8H  . insulin aspart  0-5 Units Subcutaneous QHS  . insulin aspart  0-9 Units Subcutaneous TID WC  . levETIRAcetam  500 mg Oral BID  . levofloxacin (LEVAQUIN) IV  750 mg Intravenous Q48H  . lisinopril  2.5 mg Oral Daily  . piperacillin-tazobactam (ZOSYN)  IV  3.375 g Intravenous Q8H  . Tamsulosin HCl  0.4 mg Oral QHS  . vancomycin  750 mg Intravenous Q12H   Continuous Infusions:   . sodium chloride 40 mL/hr at 11/10/11 2138    PRN Meds:.acetaminophen, acetaminophen, albuterol, bisacodyl, hydrALAZINE, metoprolol, morphine injection, ondansetron (ZOFRAN) IV, ondansetron Assessment/Plan: HCAP: CURRENTLY ON DAY 2 OF ABX, IMPROVING APPROPRIATELY. WILL REPEAT CXR IN AM. AMS: resolved. Mental status at baseline Dysphagia on dysphagia 3 diet. Left foot ulcer: on dyno boots PVD outpt follow up with vascular surgery Complete heart block on pacemaker.    LOS: 8 days   [name] 11/11/2011, 2:43 PM

## 2011-11-11 NOTE — Progress Notes (Signed)
ANTIBIOTIC CONSULT NOTE - FOLLOW UP  Pharmacy Consult for vancomycin Indication: HCAP  No Known Allergies  Patient Measurements: Height: 5\' 10"  (177.8 cm) Weight: 167 lb 8.8 oz (76 kg) IBW/kg (Calculated) : 73  Adjusted Body Weight:   Vital Signs: Temp: 98.3 F (36.8 C) (11/17 0500) Temp src: Oral (11/17 0500) BP: 138/71 mmHg (11/17 0500) Pulse Rate: 68  (11/17 0500) Intake/Output from previous day: 11/16 0701 - 11/17 0700 In: 1244.7 [P.O.:300; I.V.:344.7; IV Piggyback:600] Out: 975 [Urine:975] Intake/Output from this shift: Total I/O In: 370 [P.O.:120; IV Piggyback:250] Out: 1 [Stool:1]  Labs:  [facility] 11/11/11 0600 11/10/11 1532 11/09/11 1149  WBC 12.7* 15.0* 14.8*  HGB 7.8* 9.0* 9.2*  PLT 257 290 322  LABCREA -- -- --  CREATININE 1.31 -- 1.34   Estimated Creatinine Clearance: 44.9 ml/min (by C-G formula based on Cr of 1.31).  Basename 11/11/11 1406  VANCOTROUGH 26.9*  VANCOPEAK --  Drue Dun --  GENTTROUGH --  GENTPEAK --  GENTRANDOM --  TOBRATROUGH --  TOBRAPEAK --  TOBRARND --  AMIKACINPEAK --  AMIKACINTROU --  AMIKACIN --     Microbiology: Recent Results (from the past 720 hour(s))  URINE CULTURE     Status: Normal   Collection Time   10/19/11  3:32 PM      Component Value Range Status Comment   Specimen Description URINE, RANDOM   Final    Special Requests NONE   Final    Setup Time [id number]   Final    Colony Count NO GROWTH   Final    Culture NO GROWTH   Final    Report Status 10/20/2011 FINAL   Final   URINE CULTURE     Status: Normal   Collection Time   10/25/11  8:46 PM      Component Value Range Status Comment   Specimen Description URINE, CLEAN CATCH   Final    Special Requests NONE   Final    Setup Time [id number]   Final    Colony Count >=100,000 COLONIES/ML   Final    Culture ESCHERICHIA COLI   Final    Report Status 10/27/2011 FINAL   Final    Organism ID, Bacteria ESCHERICHIA COLI   Final   MRSA PCR SCREENING      Status: Normal   Collection Time   10/25/11 11:37 PM      Component Value Range Status Comment   MRSA by PCR NEGATIVE  NEGATIVE  Final   MRSA PCR SCREENING     Status: Normal   Collection Time   11/03/11 10:32 PM      Component Value Range Status Comment   MRSA by PCR NEGATIVE  NEGATIVE  Final   CULTURE, BLOOD (ROUTINE X 2)     Status: Normal   Collection Time   11/04/11  1:46 PM      Component Value Range Status Comment   Specimen Description BLOOD ARM RIGHT   Final    Special Requests BOTTLES DRAWN AEROBIC AND ANAEROBIC 10CC EACH   Final    Setup Time [id number]   Final    Culture NO GROWTH 5 DAYS   Final    Report Status 11/10/2011 FINAL   Final   CULTURE, BLOOD (ROUTINE X 2)     Status: Normal   Collection Time   11/04/11  2:00 PM      Component Value Range Status Comment   Specimen Description BLOOD HAND RIGHT   Final    Special  Requests BOTTLES DRAWN AEROBIC ONLY 10CC BLUE   Final    Setup Time [id number]   Final    Culture NO GROWTH 5 DAYS   Final    Report Status 11/10/2011 FINAL   Final   CULTURE, BLOOD (ROUTINE X 2)     Status: Normal (Preliminary result)   Collection Time   11/09/11 11:10 AM      Component Value Range Status Comment   Specimen Description BLOOD LEFT ARM   Final    Special Requests BOTTLES DRAWN AEROBIC AND ANAEROBIC 10CC   Final    Setup Time [id number]   Final    Culture     Final    Value:        BLOOD CULTURE RECEIVED NO GROWTH TO DATE CULTURE WILL BE HELD FOR 5 DAYS BEFORE ISSUING A FINAL NEGATIVE REPORT   Report Status PENDING   Incomplete   CULTURE, BLOOD (ROUTINE X 2)     Status: Normal (Preliminary result)   Collection Time   11/09/11 11:49 AM      Component Value Range Status Comment   Specimen Description BLOOD LEFT HAND   Final    Special Requests BOTTLES DRAWN AEROBIC ONLY 3CC   Final    Setup Time [id number]   Final    Culture     Final    Value:        BLOOD CULTURE RECEIVED NO GROWTH TO DATE CULTURE WILL BE HELD FOR 5  DAYS BEFORE ISSUING A FINAL NEGATIVE REPORT   Report Status PENDING   Incomplete     Anti-infectives     Start     Dose/Rate Route Frequency Ordered Stop   11/10/11 1600   Levofloxacin (LEVAQUIN) IVPB 750 mg        750 mg 100 mL/hr over 90 Minutes Intravenous Every 48 hours 11/10/11 1535     11/09/11 1600  piperacillin-tazobactam (ZOSYN) IVPB 3.375 g       3.375 g 12.5 mL/hr over 240 Minutes Intravenous Every 8 hours 11/09/11 1401     11/09/11 1500   vancomycin (VANCOCIN) 750 mg in sodium chloride 0.9 % 150 mL IVPB  Status:  Discontinued        750 mg 150 mL/hr over 60 Minutes Intravenous Every 12 hours 11/09/11 1400 11/11/11 1537   11/07/11 1100   doxycycline (VIBRA-TABS) tablet 100 mg  Status:  Discontinued        100 mg Oral Every 12 hours 11/07/11 1024 11/09/11 1330   11/04/11 1400   piperacillin-tazobactam (ZOSYN) IVPB 3.375 g  Status:  Discontinued        3.375 g 12.5 mL/hr over 240 Minutes Intravenous 3 times per day 11/04/11 1313 11/07/11 1031   11/04/11 1400   vancomycin (VANCOCIN) IVPB 1000 mg/200 mL premix  Status:  Discontinued        1,000 mg 200 mL/hr over 60 Minutes Intravenous Every 12 hours 11/04/11 1335 11/07/11 1024   11/04/11 1000   levofloxacin (LEVAQUIN) tablet 500 mg  Status:  Discontinued        500 mg Oral Daily 11/03/11 2101 11/03/11 2128   11/04/11 0800   cefUROXime (CEFTIN) tablet 500 mg  Status:  Discontinued        500 mg Oral 2 times daily with meals 11/03/11 2101 11/03/11 2125   11/03/11 2230   cefTRIAXone (ROCEPHIN) 1 g in dextrose 5 % 50 mL IVPB  Status:  Discontinued        1 g  100 mL/hr over 30 Minutes Intravenous Every 24 hours 11/03/11 2125 11/04/11 1053   11/03/11 2230   levofloxacin (LEVAQUIN) IVPB 500 mg  Status:  Discontinued        500 mg 100 mL/hr over 60 Minutes Intravenous Every 24 hours 11/03/11 2128 11/04/11 1053          Assessment: 77yo male with HCAP is currently on supratherapeutic Vancomycin.  Goal of Therapy:    Vancomycin trough level 15-20 mcg/ml  Plan:  1) Discontinue standing order of vancomycin 2) Check a random vancomycin level tomorrow at 1400 (If <20, probably can restart vancomycin 750mg  iv q24h). Will follow.  Idara Woodside, [place] 11/11/2011,3:37 PM

## 2011-11-11 NOTE — Consults (Signed)
1610  Notified pharmacy regarding vanc trough level, last dose documented was around 6am this morning.  Pinchos Topel RN

## 2011-11-11 NOTE — Progress Notes (Signed)
(  Late entry for 11/10/11)  Pt briefly assessed. Noted to be more awake and alert.   Speech slurred but coherent.  Joking with me.  I will contact his daughter to update and reassess in am in light of improvements.  Daughters goals of care were clear.  To try to get him back to a baseline function, so that he could return to SNF with ultimate goal for back to his apartment if he can every  rehab.  She is, however, realistic and if the sum of all his illnesses is too much for him to overcome she would make decisions that would be compatible with his current state of function.     Daphyne Miguez L. Ladona Ridgel, MD MBA The Palliative Medicine Team at [facility] Phone: [phone] Pager: [phone]

## 2011-11-12 ENCOUNTER — Inpatient Hospital Stay (HOSPITAL_COMMUNITY): Payer: Medicare Other

## 2011-11-12 LAB — GLUCOSE, CAPILLARY
Glucose-Capillary: 112 mg/dL — ABNORMAL HIGH (ref 70–99)
Glucose-Capillary: 201 mg/dL — ABNORMAL HIGH (ref 70–99)
Glucose-Capillary: 253 mg/dL — ABNORMAL HIGH (ref 70–99)

## 2011-11-12 LAB — VANCOMYCIN, RANDOM: Vancomycin Rm: 24 ug/mL

## 2011-11-12 NOTE — Plan of Care (Signed)
Problem: Problem: Diet/Nutrition Progression Goal: ADEQUATE NUTRITION Outcome: Not Progressing Poor appetite, not eating, drinks some

## 2011-11-12 NOTE — Plan of Care (Signed)
Problem: Phase III Progression Outcomes Goal: Activity at appropriate level-compared to baseline (UP IN CHAIR FOR HEMODIALYSIS)  Outcome: Not Progressing Pt unable to get oob with assistance, need to use lift, pt is very lethargic today, sinus braddy at times, oral mucosa dry with frequent oral care, poor appetite, has eaten 2 bites of food today, MD aware.

## 2011-11-12 NOTE — Plan of Care (Signed)
Problem: Problem: Skin/Wound Progression Goal: APPROPRIATE NUTRITIONAL STATUS Outcome: Not Progressing Gets protein, also ensure supplements but not taking any today, lethargic.

## 2011-11-12 NOTE — Plan of Care (Signed)
Problem: Problem: Skin/Wound Progression Goal: ADEQUATE MOBILITY PROGRESSION Outcome: Not Progressing Unable to get up with assistance, use lift however pt more lethargic today, did not get oob

## 2011-11-12 NOTE — Plan of Care (Signed)
Problem: Problem: Skin/Wound Progression Goal: HEALING SURGICAL WOUND Outcome: Not Progressing Reddened skin areas of lat left foot, redness continues to left heel, necrosis skin break to left heel with dsg changes daily, wearing boots from nsg home alternating on and off.

## 2011-11-12 NOTE — Progress Notes (Signed)
Subjective: Patient appears more lethargic today he is arousable answering questions appropriately but appears more sleepy. He did state that he was hungry and would like to eat something. Has per the nurse he did not eat his breakfast and was refusing to get up from the bed. Objective: Vital signs in last 24 hours: Filed Vitals:   11/11/11 1710 11/11/11 2120 11/12/11 0516 11/12/11 1400  BP: 188/64 186/62 193/66 185/61  Pulse: 60 59 54 56  Temp:  99.1 F (37.3 C) 98.8 F (37.1 C) 98.1 F (36.7 C)  TempSrc:  Oral Oral Oral  Resp:  18 20 15   Height:      Weight:   77.2 kg (170 lb 3.1 oz)   SpO2:  94% 94% 96%   Weight change:   Intake/Output Summary (Last 24 hours) at 11/12/11 1750 Last data filed at 11/12/11 1732  Gross per 24 hour  Intake    660 ml  Output    925 ml  Net   -265 ml   Physical Exam:   General Appearance:    More lethargic today.  Lungs:     Fair air entry, no wheezing or rhonchi.   Heart:    Regular rate and rhythm, S1 and S2 normal,  Abdomen:     Soft, non-tender, bowel sounds active all four quadrants,     Extremities:  left foot ulcer , in boots.      Skin:   Skin color, texture, turgor normal, no rashes or lesions  Neurologic:   lethargic, left hemiparesis      Lab Results: Results for orders placed during the hospital encounter of 11/03/11 (from the past 48 hour(s))  GLUCOSE, CAPILLARY     Status: Abnormal   Collection Time   11/10/11 10:23 PM      Component Value Range Comment   Glucose-Capillary 191 (*) 70 - 99 (mg/dL)    Comment 1 Notify RN      Comment 2 Documented in Chart     BASIC METABOLIC PANEL     Status: Abnormal   Collection Time   11/11/11  6:00 AM      Component Value Range Comment   Sodium 143  135 - 145 (mEq/L)    Potassium 4.6  3.5 - 5.1 (mEq/L)    Chloride 109  96 - 112 (mEq/L)    CO2 27  19 - 32 (mEq/L)    Glucose, Bld 145 (*) 70 - 99 (mg/dL)    BUN 21  6 - 23 (mg/dL)    Creatinine, Ser 4.09  0.50 - 1.35 (mg/dL)    Calcium 8.1 (*) 8.4 - 10.5 (mg/dL)    GFR calc non Af Amer 49 (*) >90 (mL/min)    GFR calc Af Amer 57 (*) >90 (mL/min)   CBC     Status: Abnormal   Collection Time   11/11/11  6:00 AM      Component Value Range Comment   WBC 12.7 (*) 4.0 - 10.5 (K/uL)    RBC 2.71 (*) 4.22 - 5.81 (MIL/uL)    Hemoglobin 7.8 (*) 13.0 - 17.0 (g/dL)    HCT 81.1 (*) 91.4 - 52.0 (%)    MCV 90.0  78.0 - 100.0 (fL)    MCH 28.8  26.0 - 34.0 (pg)    MCHC 32.0  30.0 - 36.0 (g/dL)    RDW 78.2  95.6 - 21.3 (%)    Platelets 257  150 - 400 (K/uL)   GLUCOSE, CAPILLARY  Status: Abnormal   Collection Time   11/11/11  7:53 AM      Component Value Range Comment   Glucose-Capillary 144 (*) 70 - 99 (mg/dL)   GLUCOSE, CAPILLARY     Status: Abnormal   Collection Time   11/11/11 11:59 AM      Component Value Range Comment   Glucose-Capillary 219 (*) 70 - 99 (mg/dL)   VANCOMYCIN, TROUGH     Status: Abnormal   Collection Time   11/11/11  2:06 PM      Component Value Range Comment   Vancomycin Tr 26.9 (*) 10.0 - 20.0 (ug/mL)   CBC     Status: Abnormal   Collection Time   11/11/11  4:34 PM      Component Value Range Comment   WBC 13.7 (*) 4.0 - 10.5 (K/uL)    RBC 2.82 (*) 4.22 - 5.81 (MIL/uL)    Hemoglobin 8.0 (*) 13.0 - 17.0 (g/dL)    HCT 16.1 (*) 09.6 - 52.0 (%)    MCV 89.7  78.0 - 100.0 (fL)    MCH 28.4  26.0 - 34.0 (pg)    MCHC 31.6  30.0 - 36.0 (g/dL)    RDW 04.5  40.9 - 81.1 (%)    Platelets 250  150 - 400 (K/uL)   GLUCOSE, CAPILLARY     Status: Abnormal   Collection Time   11/11/11  5:08 PM      Component Value Range Comment   Glucose-Capillary 159 (*) 70 - 99 (mg/dL)   GLUCOSE, CAPILLARY     Status: Abnormal   Collection Time   11/11/11  9:12 PM      Component Value Range Comment   Glucose-Capillary 146 (*) 70 - 99 (mg/dL)    Comment 1 Notify RN     GLUCOSE, CAPILLARY     Status: Abnormal   Collection Time   11/12/11  8:03 AM      Component Value Range Comment   Glucose-Capillary 112 (*) 70 -  99 (mg/dL)   GLUCOSE, CAPILLARY     Status: Abnormal   Collection Time   11/12/11 11:53 AM      Component Value Range Comment   Glucose-Capillary 201 (*) 70 - 99 (mg/dL)    Comment 1 Documented in Chart      Comment 2 Notify RN     VANCOMYCIN, RANDOM     Status: Normal   Collection Time   11/12/11  2:29 PM      Component Value Range Comment   Vancomycin Rm 24.0     GLUCOSE, CAPILLARY     Status: Abnormal   Collection Time   11/12/11  5:44 PM      Component Value Range Comment   Glucose-Capillary 129 (*) 70 - 99 (mg/dL)    Comment 1 Documented in Chart      Comment 2 Notify RN       Micro Results: Recent Results (from the past 240 hour(s))  MRSA PCR SCREENING     Status: Normal   Collection Time   11/03/11 10:32 PM      Component Value Range Status Comment   MRSA by PCR NEGATIVE  NEGATIVE  Final   CULTURE, BLOOD (ROUTINE X 2)     Status: Normal   Collection Time   11/04/11  1:46 PM      Component Value Range Status Comment   Specimen Description BLOOD ARM RIGHT   Final    Special Requests BOTTLES DRAWN AEROBIC AND  ANAEROBIC 10CC EACH   Final    Setup Time [id number]   Final    Culture NO GROWTH 5 DAYS   Final    Report Status 11/10/2011 FINAL   Final   CULTURE, BLOOD (ROUTINE X 2)     Status: Normal   Collection Time   11/04/11  2:00 PM      Component Value Range Status Comment   Specimen Description BLOOD HAND RIGHT   Final    Special Requests BOTTLES DRAWN AEROBIC ONLY 10CC BLUE   Final    Setup Time [id number]   Final    Culture NO GROWTH 5 DAYS   Final    Report Status 11/10/2011 FINAL   Final   CULTURE, BLOOD (ROUTINE X 2)     Status: Normal (Preliminary result)   Collection Time   11/09/11 11:10 AM      Component Value Range Status Comment   Specimen Description BLOOD LEFT ARM   Final    Special Requests BOTTLES DRAWN AEROBIC AND ANAEROBIC 10CC   Final    Setup Time [id number]   Final    Culture     Final    Value:        BLOOD CULTURE RECEIVED NO  GROWTH TO DATE CULTURE WILL BE HELD FOR 5 DAYS BEFORE ISSUING A FINAL NEGATIVE REPORT   Report Status PENDING   Incomplete   CULTURE, BLOOD (ROUTINE X 2)     Status: Normal (Preliminary result)   Collection Time   11/09/11 11:49 AM      Component Value Range Status Comment   Specimen Description BLOOD LEFT HAND   Final    Special Requests BOTTLES DRAWN AEROBIC ONLY 3CC   Final    Setup Time [id number]   Final    Culture     Final    Value:        BLOOD CULTURE RECEIVED NO GROWTH TO DATE CULTURE WILL BE HELD FOR 5 DAYS BEFORE ISSUING A FINAL NEGATIVE REPORT   Report Status PENDING   Incomplete    Studies/Results: Dg Chest 1 View  11/12/2011  *RADIOLOGY REPORT*  Clinical Data: Assess for resolution of left lower lobe opacity  CHEST - 1 VIEW  Comparison: 11/09/2011  Findings: Minimal left basilar atelectasis, improved. No pleural effusion or pneumothorax.  Cardiomegaly.  Left subclavian pacemaker.  IMPRESSION: Minimal left basilar atelectasis, improved.  Cardiomegaly.  Original Report Authenticated By: Charline Bills, M.D.   Medications: Reviewed Scheduled Meds:   . carvedilol  3.125 mg Oral BID WC  . clopidogrel  75 mg Oral Q breakfast  . collagenase   Topical Daily  . feeding supplement  237 mL Oral TID WC  . feeding supplement  30 mL Oral BID  . gabapentin  300 mg Oral TID  . heparin  5,000 Units Subcutaneous Q8H  . insulin aspart  0-5 Units Subcutaneous QHS  . insulin aspart  0-9 Units Subcutaneous TID WC  . levETIRAcetam  500 mg Oral BID  . levofloxacin (LEVAQUIN) IV  750 mg Intravenous Q48H  . lisinopril  2.5 mg Oral Daily  . piperacillin-tazobactam (ZOSYN)  IV  3.375 g Intravenous Q8H  . Tamsulosin HCl  0.4 mg Oral QHS   Continuous Infusions:   . sodium chloride 40 mL/hr at 11/10/11 2138   PRN Meds:.acetaminophen, acetaminophen, albuterol, bisacodyl, hydrALAZINE, metoprolol, morphine injection, ondansetron (ZOFRAN) IV, ondansetron Assessment/Plan: Principal  Problem:  HCAP: CURRENTLY ON DAY  OF ABX, IMPROVING APPROPRIATELY. Chest x-ray shows improvement in  the left basilar opacity. We'll de-escalate the antibiotics to avelox.  AMS: more lethargic, but arousable and answering simple questions appropriately.  Dysphagia on dysphagia 3 diet.  Left foot ulcer: on dyno boots  PVD outpt follow up with vascular surgery  Complete heart block on pacemaker.    LOS: 9 days   Pressley Tadesse 11/12/2011, 5:50 PM

## 2011-11-12 NOTE — Plan of Care (Signed)
Problem: Problem: Mobility Progression Goal: IMPROVEMENT OF STRENGTH/FATIGUE Outcome: Not Progressing Lethargic, not getting oob or eating

## 2011-11-12 NOTE — Progress Notes (Signed)
ANTIBIOTIC CONSULT NOTE - FOLLOW UP  Pharmacy Consult for Vancomycin, Zosyn, Levaquin Indication: HCAP  Assessment: 77 yo male on Day #3 of empiric antibiotics for HCAP.  No respiratory cultures, but blood cultures have NGTD. Patient remains afebrile. SCr increased to 1.3 from baseline. Vanco trough yesterday was supratherapeutic at 26.9.  Random level after 24 hours is 24. Calculated Ke=0.0094 & t1/2=74hr. Vanco level should be near 20mg /dL after another 19 hours.  Goal of Therapy:  Vancomycin trough level 15-20 mcg/ml  Plan:  1. Continue zosyn 3.375gm IV q8h & Levaquin 750mg  IV q48h 2. Continue to hold vanco 3. Check BMET in AM   [name] 11/12/2011,2:40 PM  No Known Allergies  Patient Measurements: Height: 5\' 10"  (177.8 cm) Weight: 170 lb 3.1 oz (77.2 kg) IBW/kg (Calculated) : 73   Vital Signs: Temp: 98.8 F (37.1 C) (11/18 0516) Temp src: Oral (11/18 0516) BP: 193/66 mmHg (11/18 0516) Pulse Rate: 54  (11/18 0516) Intake/Output from previous day: 11/17 0701 - 11/18 0700 In: 540 [P.O.:240; IV Piggyback:300] Out: 776 [Urine:775; Stool:1] Intake/Output from this shift: Total I/O In: 290 [P.O.:240; IV Piggyback:50] Out: 150 [Urine:150]  Labs:  [facility] 11/11/11 1634 11/11/11 0600 11/10/11 1532  WBC 13.7* 12.7* 15.0*  HGB 8.0* 7.8* 9.0*  PLT 250 257 290  LABCREA -- -- --  CREATININE -- 1.31 --   Estimated Creatinine Clearance: 44.9 ml/min (by C-G formula based on Cr of 1.31).  Basename 11/11/11 1406  VANCOTROUGH 26.9*  VANCOPEAK --  Drue Dun --  GENTTROUGH --  GENTPEAK --  GENTRANDOM --  TOBRATROUGH --  TOBRAPEAK --  TOBRARND --  AMIKACINPEAK --  AMIKACINTROU --  AMIKACIN --     Microbiology: Recent Results (from the past 720 hour(s))  URINE CULTURE     Status: Normal   Collection Time   10/19/11  3:32 PM      Component Value Range Status Comment   Specimen Description URINE, RANDOM   Final    Special Requests NONE   Final    Setup  Time [id number]   Final    Colony Count NO GROWTH   Final    Culture NO GROWTH   Final    Report Status 10/20/2011 FINAL   Final   URINE CULTURE     Status: Normal   Collection Time   10/25/11  8:46 PM      Component Value Range Status Comment   Specimen Description URINE, CLEAN CATCH   Final    Special Requests NONE   Final    Setup Time [id number]   Final    Colony Count >=100,000 COLONIES/ML   Final    Culture ESCHERICHIA COLI   Final    Report Status 10/27/2011 FINAL   Final    Organism ID, Bacteria ESCHERICHIA COLI   Final   MRSA PCR SCREENING     Status: Normal   Collection Time   10/25/11 11:37 PM      Component Value Range Status Comment   MRSA by PCR NEGATIVE  NEGATIVE  Final   MRSA PCR SCREENING     Status: Normal   Collection Time   11/03/11 10:32 PM      Component Value Range Status Comment   MRSA by PCR NEGATIVE  NEGATIVE  Final   CULTURE, BLOOD (ROUTINE X 2)     Status: Normal   Collection Time   11/04/11  1:46 PM      Component Value Range Status Comment   Specimen  Description BLOOD ARM RIGHT   Final    Special Requests BOTTLES DRAWN AEROBIC AND ANAEROBIC 10CC EACH   Final    Setup Time [id number]   Final    Culture NO GROWTH 5 DAYS   Final    Report Status 11/10/2011 FINAL   Final   CULTURE, BLOOD (ROUTINE X 2)     Status: Normal   Collection Time   11/04/11  2:00 PM      Component Value Range Status Comment   Specimen Description BLOOD HAND RIGHT   Final    Special Requests BOTTLES DRAWN AEROBIC ONLY 10CC BLUE   Final    Setup Time [id number]   Final    Culture NO GROWTH 5 DAYS   Final    Report Status 11/10/2011 FINAL   Final   CULTURE, BLOOD (ROUTINE X 2)     Status: Normal (Preliminary result)   Collection Time   11/09/11 11:10 AM      Component Value Range Status Comment   Specimen Description BLOOD LEFT ARM   Final    Special Requests BOTTLES DRAWN AEROBIC AND ANAEROBIC 10CC   Final    Setup Time [id number]   Final    Culture     Final     Value:        BLOOD CULTURE RECEIVED NO GROWTH TO DATE CULTURE WILL BE HELD FOR 5 DAYS BEFORE ISSUING A FINAL NEGATIVE REPORT   Report Status PENDING   Incomplete   CULTURE, BLOOD (ROUTINE X 2)     Status: Normal (Preliminary result)   Collection Time   11/09/11 11:49 AM      Component Value Range Status Comment   Specimen Description BLOOD LEFT HAND   Final    Special Requests BOTTLES DRAWN AEROBIC ONLY 3CC   Final    Setup Time [id number]   Final    Culture     Final    Value:        BLOOD CULTURE RECEIVED NO GROWTH TO DATE CULTURE WILL BE HELD FOR 5 DAYS BEFORE ISSUING A FINAL NEGATIVE REPORT   Report Status PENDING   Incomplete     Anti-infectives     Start     Dose/Rate Route Frequency Ordered Stop   11/10/11 1600   Levofloxacin (LEVAQUIN) IVPB 750 mg        750 mg 100 mL/hr over 90 Minutes Intravenous Every 48 hours 11/10/11 1535     11/09/11 1600  piperacillin-tazobactam (ZOSYN) IVPB 3.375 g       3.375 g 12.5 mL/hr over 240 Minutes Intravenous Every 8 hours 11/09/11 1401     11/09/11 1500   vancomycin (VANCOCIN) 750 mg in sodium chloride 0.9 % 150 mL IVPB  Status:  Discontinued        750 mg 150 mL/hr over 60 Minutes Intravenous Every 12 hours 11/09/11 1400 11/11/11 1537   11/07/11 1100   doxycycline (VIBRA-TABS) tablet 100 mg  Status:  Discontinued        100 mg Oral Every 12 hours 11/07/11 1024 11/09/11 1330   11/04/11 1400   piperacillin-tazobactam (ZOSYN) IVPB 3.375 g  Status:  Discontinued        3.375 g 12.5 mL/hr over 240 Minutes Intravenous 3 times per day 11/04/11 1313 11/07/11 1031   11/04/11 1400   vancomycin (VANCOCIN) IVPB 1000 mg/200 mL premix  Status:  Discontinued        1,000 mg 200 mL/hr over 60 Minutes Intravenous Every 12  hours 11/04/11 1335 11/07/11 1024   11/04/11 1000   levofloxacin (LEVAQUIN) tablet 500 mg  Status:  Discontinued        500 mg Oral Daily 11/03/11 2101 11/03/11 2128   11/04/11 0800   cefUROXime (CEFTIN) tablet 500 mg   Status:  Discontinued        500 mg Oral 2 times daily with meals 11/03/11 2101 11/03/11 2125   11/03/11 2230   cefTRIAXone (ROCEPHIN) 1 g in dextrose 5 % 50 mL IVPB  Status:  Discontinued        1 g 100 mL/hr over 30 Minutes Intravenous Every 24 hours 11/03/11 2125 11/04/11 1053   11/03/11 2230   levofloxacin (LEVAQUIN) IVPB 500 mg  Status:  Discontinued        500 mg 100 mL/hr over 60 Minutes Intravenous Every 24 hours 11/03/11 2128 11/04/11 1053

## 2011-11-12 NOTE — Plan of Care (Signed)
Problem: Problem: Skin/Wound Progression Goal: HEALING PRESSURE ULCER Outcome: Not Progressing See surgical wound docementation, same

## 2011-11-13 LAB — CBC
Hemoglobin: 7 g/dL — ABNORMAL LOW (ref 13.0–17.0)
MCH: 28.7 pg (ref 26.0–34.0)
MCV: 87.7 fL (ref 78.0–100.0)
RBC: 2.44 MIL/uL — ABNORMAL LOW (ref 4.22–5.81)
WBC: 11.2 10*3/uL — ABNORMAL HIGH (ref 4.0–10.5)

## 2011-11-13 LAB — HEMOGLOBIN AND HEMATOCRIT, BLOOD
HCT: 21.7 % — ABNORMAL LOW (ref 39.0–52.0)
Hemoglobin: 7 g/dL — ABNORMAL LOW (ref 13.0–17.0)

## 2011-11-13 LAB — RETICULOCYTES
RBC.: 2.46 MIL/uL — ABNORMAL LOW (ref 4.22–5.81)
Retic Count, Absolute: 29.5 10*3/uL (ref 19.0–186.0)
Retic Ct Pct: 1.2 % (ref 0.4–3.1)

## 2011-11-13 LAB — BASIC METABOLIC PANEL
Calcium: 8.2 mg/dL — ABNORMAL LOW (ref 8.4–10.5)
Creatinine, Ser: 1.28 mg/dL (ref 0.50–1.35)
GFR calc Af Amer: 58 mL/min — ABNORMAL LOW (ref >90)
GFR calc non Af Amer: 50 mL/min — ABNORMAL LOW (ref >90)

## 2011-11-13 LAB — GLUCOSE, CAPILLARY
Glucose-Capillary: 121 mg/dL — ABNORMAL HIGH (ref 70–99)
Glucose-Capillary: 250 mg/dL — ABNORMAL HIGH (ref 70–99)

## 2011-11-13 LAB — NO BLOOD PRODUCTS

## 2011-11-13 MED ORDER — OXYCODONE HCL 5 MG PO TABS
5.0000 mg | ORAL_TABLET | ORAL | Status: DC | PRN
  Administered 2011-11-13: 5 mg via ORAL
  Filled 2011-11-13: qty 1

## 2011-11-13 MED ORDER — MOXIFLOXACIN HCL IN NACL 400 MG/250ML IV SOLN
400.0000 mg | INTRAVENOUS | Status: DC
  Administered 2011-11-13 – 2011-11-14 (×2): 400 mg via INTRAVENOUS
  Filled 2011-11-13 (×2): qty 250

## 2011-11-13 NOTE — Progress Notes (Signed)
Per MD, patient is not ready to dc. CSW called dtr who stated she had released bed hold at [facility] as of 11/16. CSW encouraged dtr to look at other facilities in case [facility] did not have available beds at dc. Dtr stated patient had been to [facility] and [facility] in the past and she preferred to look at other facilities. CSW emailed SNF list to dtr. CSW will continue to follow.

## 2011-11-13 NOTE — Progress Notes (Signed)
Speech Pathology: Dysphagia Treatment Note  Patient was observed with : Pureed and Thin liquids.  Patient was noted to have s/s of aspiration : No:    Lung Sounds:  Diminished breath sounds in lower lobes Temperature: 99  Patient required: min cues to consistently follow precautions/strategies  Other: Pt consumed magic cup and water with no s/s of aspiration. Pt with oral motor deficits and observed to have left buccal pocketing when consuming magic cup, however cleared with verbal cued and independent straw sips of water. Pt is improving in alertness and awareness of deficits. Verbally demonstrated understanding of compensatory strategy to take intermittent sips of thin liquid after bites of puree. At this time, pt appears appropriate to continue current diet.  Recommendations:  Continue current diet With increased strength and oral motor function, attempt trials of soft solids.   Pain:   mild, in his waist.  Intervention Required:   Yes Repositioned   Goals: Progressing towards goals.   Chyrel Masson, Speech Pathology Student 11/13/2011

## 2011-11-13 NOTE — Plan of Care (Signed)
Problem: Phase II Progression Outcomes Goal: Discharge plan established Outcome: Progressing Patient discharging to SNF

## 2011-11-13 NOTE — Plan of Care (Signed)
Problem: Phase I Progression Outcomes Goal: Initial discharge plan identified Outcome: Completed/Met Date Met:  11/13/11 Patient discharging to SNF

## 2011-11-13 NOTE — Progress Notes (Signed)
Subjective: Pain  In the foot. Pt hgb dropped to 7. Pt is jehovah's witness, denied any blood and blood products. Discussed with the pt daughter and explained, discussed and updated the pts condition. Objective: Vital signs in last 24 hours: Filed Vitals:   11/13/11 0901 11/13/11 1229 11/13/11 1234 11/13/11 1456  BP: 138/80 185/67 188/82 164/68  Pulse: 68 51 59 55  Temp:  99 F (37.2 C)  98.4 F (36.9 C)  TempSrc:  Oral  Oral  Resp:  18 18 18   Height:      Weight:      SpO2:  100% 100% 100%   Weight change: 1 kg (2 lb 3.3 oz)  Intake/Output Summary (Last 24 hours) at 11/13/11 2022 Last data filed at 11/13/11 1900  Gross per 24 hour  Intake 1355.33 ml  Output    450 ml  Net 905.33 ml   Physical Exam:   General Appearance:    Alert, cooperative, no distress, appears stated age  Lungs:     Decreased at bases. respirations unlabored   Heart:    Occasionally bradycardic  Abdomen:     Soft, non-tender, bowel sounds active all four quadrants,    no masses, no organomegaly  Extremities:   Left foot ulcer in boots     Skin:   Skin color, texture, turgor normal, no rashes or lesions  Neurologic:   Mental status at baseline. Left hemiplegia     Lab Results: Results for orders placed during the hospital encounter of 11/03/11 (from the past 24 hour(s))  GLUCOSE, CAPILLARY     Status: Abnormal   Collection Time   11/12/11  9:26 PM      Component Value Range   Glucose-Capillary 253 (*) 70 - 99 (mg/dL)   Comment 1 Notify RN    BASIC METABOLIC PANEL     Status: Abnormal   Collection Time   11/13/11  6:20 AM      Component Value Range   Sodium 143  135 - 145 (mEq/L)   Potassium 4.0  3.5 - 5.1 (mEq/L)   Chloride 109  96 - 112 (mEq/L)   CO2 27  19 - 32 (mEq/L)   Glucose, Bld 134 (*) 70 - 99 (mg/dL)   BUN 21  6 - 23 (mg/dL)   Creatinine, Ser 4.09  0.50 - 1.35 (mg/dL)   Calcium 8.2 (*) 8.4 - 10.5 (mg/dL)   GFR calc non Af Amer 50 (*) >90 (mL/min)   GFR calc Af Amer 58 (*) >90  (mL/min)  CBC     Status: Abnormal   Collection Time   11/13/11  6:20 AM      Component Value Range   WBC 11.2 (*) 4.0 - 10.5 (K/uL)   RBC 2.44 (*) 4.22 - 5.81 (MIL/uL)   Hemoglobin 7.0 (*) 13.0 - 17.0 (g/dL)   HCT 81.1 (*) 91.4 - 52.0 (%)   MCV 87.7  78.0 - 100.0 (fL)   MCH 28.7  26.0 - 34.0 (pg)   MCHC 32.7  30.0 - 36.0 (g/dL)   RDW 78.2  95.6 - 21.3 (%)   Platelets 232  150 - 400 (K/uL)  GLUCOSE, CAPILLARY     Status: Abnormal   Collection Time   11/13/11  7:59 AM      Component Value Range   Glucose-Capillary 121 (*) 70 - 99 (mg/dL)  GLUCOSE, CAPILLARY     Status: Abnormal   Collection Time   11/13/11 12:22 PM  Component Value Range   Glucose-Capillary 250 (*) 70 - 99 (mg/dL)  NO BLOOD PRODUCTS     Status: Normal   Collection Time   11/13/11  3:10 PM      Component Value Range   Transfuse no blood products       Value: TRANSFUSE NO BLOOD PRODUCTS, VERIFIED BY SHINITA LEE  GLUCOSE, CAPILLARY     Status: Abnormal   Collection Time   11/13/11  4:51 PM      Component Value Range   Glucose-Capillary 141 (*) 70 - 99 (mg/dL)    Micro Results: Recent Results (from the past 240 hour(s))  MRSA PCR SCREENING     Status: Normal   Collection Time   11/03/11 10:32 PM      Component Value Range Status Comment   MRSA by PCR NEGATIVE  NEGATIVE  Final   CULTURE, BLOOD (ROUTINE X 2)     Status: Normal   Collection Time   11/04/11  1:46 PM      Component Value Range Status Comment   Specimen Description BLOOD ARM RIGHT   Final    Special Requests BOTTLES DRAWN AEROBIC AND ANAEROBIC 10CC EACH   Final    Setup Time [id number]   Final    Culture NO GROWTH 5 DAYS   Final    Report Status 11/10/2011 FINAL   Final   CULTURE, BLOOD (ROUTINE X 2)     Status: Normal   Collection Time   11/04/11  2:00 PM      Component Value Range Status Comment   Specimen Description BLOOD HAND RIGHT   Final    Special Requests BOTTLES DRAWN AEROBIC ONLY 10CC BLUE   Final    Setup Time  [id number]   Final    Culture NO GROWTH 5 DAYS   Final    Report Status 11/10/2011 FINAL   Final   CULTURE, BLOOD (ROUTINE X 2)     Status: Normal (Preliminary result)   Collection Time   11/09/11 11:10 AM      Component Value Range Status Comment   Specimen Description BLOOD LEFT ARM   Final    Special Requests BOTTLES DRAWN AEROBIC AND ANAEROBIC 10CC   Final    Setup Time [id number]   Final    Culture     Final    Value:        BLOOD CULTURE RECEIVED NO GROWTH TO DATE CULTURE WILL BE HELD FOR 5 DAYS BEFORE ISSUING A FINAL NEGATIVE REPORT   Report Status PENDING   Incomplete   CULTURE, BLOOD (ROUTINE X 2)     Status: Normal (Preliminary result)   Collection Time   11/09/11 11:49 AM      Component Value Range Status Comment   Specimen Description BLOOD LEFT HAND   Final    Special Requests BOTTLES DRAWN AEROBIC ONLY 3CC   Final    Setup Time [id number]   Final    Culture     Final    Value:        BLOOD CULTURE RECEIVED NO GROWTH TO DATE CULTURE WILL BE HELD FOR 5 DAYS BEFORE ISSUING A FINAL NEGATIVE REPORT   Report Status PENDING   Incomplete    Studies/Results: Dg Chest 1 View  11/12/2011  *RADIOLOGY REPORT*  Clinical Data: Assess for resolution of left lower lobe opacity  CHEST - 1 VIEW  Comparison: 11/09/2011  Findings: Minimal left basilar atelectasis, improved. No pleural effusion or pneumothorax.  Cardiomegaly.  Left subclavian pacemaker.  IMPRESSION: Minimal left basilar atelectasis, improved.  Cardiomegaly.  Original Report Authenticated By: Charline Bills, M.D.   Medications:  reviewed Scheduled Meds:   . carvedilol  3.125 mg Oral BID WC  . clopidogrel  75 mg Oral Q breakfast  . collagenase   Topical Daily  . feeding supplement  237 mL Oral TID WC  . feeding supplement  30 mL Oral BID  . gabapentin  300 mg Oral TID  . heparin  5,000 Units Subcutaneous Q8H  . insulin aspart  0-5 Units Subcutaneous QHS  . insulin aspart  0-9 Units Subcutaneous TID WC  .  levETIRAcetam  500 mg Oral BID  . lisinopril  2.5 mg Oral Daily  . moxifloxacin  400 mg Intravenous Q24H  . Tamsulosin HCl  0.4 mg Oral QHS  . DISCONTD: levofloxacin (LEVAQUIN) IV  750 mg Intravenous Q48H  . DISCONTD: piperacillin-tazobactam (ZOSYN)  IV  3.375 g Intravenous Q8H   Continuous Infusions:   . sodium chloride 40 mL/hr at 11/12/11 2235   PRN Meds:.acetaminophen, acetaminophen, albuterol, bisacodyl, hydrALAZINE, metoprolol, morphine injection, ondansetron (ZOFRAN) IV, ondansetron, oxyCODONE Assessment/Plan:  LOS: 10 days  HCAP: CURRENTLY ON DAY OF5  ABX, IMPROVING APPROPRIATELY. Chest x-ray shows improvement in the left basilar opacity.  On avelox.  AMS: mental status at baseline Dysphagia on dysphagia 3 diet.  Left foot ulcer: on dyno boots  PVD outpt follow up with vascular surgery  Complete heart block on pacemaker. Anemia: ? Hemodilution. Stool for occult blood. Pt is Luan Moore witness. Will get anemia profile.  Amilya Haver 11/13/2011, 8:22 PM

## 2011-11-14 DIAGNOSIS — J189 Pneumonia, unspecified organism: Secondary | ICD-10-CM | POA: Diagnosis not present

## 2011-11-14 LAB — CBC
Hemoglobin: 7 g/dL — ABNORMAL LOW (ref 13.0–17.0)
MCH: 28.5 pg (ref 26.0–34.0)
MCHC: 31.7 g/dL (ref 30.0–36.0)
Platelets: 227 10*3/uL (ref 150–400)
RDW: 14.4 % (ref 11.5–15.5)

## 2011-11-14 LAB — FOLATE: Folate: 5.3 ng/mL

## 2011-11-14 LAB — IRON AND TIBC
Saturation Ratios: 21 % (ref 20–55)
UIBC: 80 ug/dL — ABNORMAL LOW (ref 125–400)

## 2011-11-14 MED ORDER — PRO-STAT SUGAR FREE PO LIQD: 30.0000 mL | Freq: Two times a day (BID) | ORAL | Status: AC

## 2011-11-14 MED ORDER — COLLAGENASE 250 UNIT/GM EX OINT: TOPICAL_OINTMENT | Freq: Every day | CUTANEOUS | Status: AC

## 2011-11-14 MED ORDER — MOXIFLOXACIN HCL 400 MG PO TABS: 400.0000 mg | ORAL_TABLET | Freq: Every day | ORAL | Status: AC

## 2011-11-14 MED ORDER — LISINOPRIL 2.5 MG PO TABS: 2.5000 mg | ORAL_TABLET | Freq: Every day | ORAL | Status: DC

## 2011-11-14 MED ORDER — BISACODYL 10 MG RE SUPP: 10.0000 mg | Freq: Every day | RECTAL | Status: AC | PRN

## 2011-11-14 MED ORDER — ENSURE CLINICAL ST REVIGOR PO LIQD: 237.0000 mL | Freq: Three times a day (TID) | ORAL | Status: AC

## 2011-11-14 MED ORDER — LEVETIRACETAM 500 MG PO TABS: 500.0000 mg | ORAL_TABLET | Freq: Two times a day (BID) | ORAL | Status: AC

## 2011-11-14 NOTE — Progress Notes (Signed)
CSW spoke with dtr who wants patient to return to [facility]. CSW spoke with SNF who is agreeable to accept patient. CSW faxed dc summary and medications to SNF. CSW informed patient, patient's dtr and RN of dc and all were agreeable. CSW prepared dc packet. CSW coordinated transportation via [place]. CSW is signing off.

## 2011-11-14 NOTE — Discharge Summary (Signed)
PATIENT DETAILS Name: [name] Age: 77 y.o. Sex: male Date of Birth: 11-11-1927 MRN: [id number]. Admit Date: 11/03/2011 Admitting Physician: Leroy Sea, MD FAO:[id number],HQIONGE Kellie Shropshire, MD  PRIMARY DISCHARGE DIAGNOSIS: 1.  metabolic encephalopathy secondary to acute renal failure/drug overdose. 2. Chronic left heel decubitus ulcer  #3 peripheral vascular disease and candidate for revascularization procedure  #4 chronic systolic heart failure with ejection fraction of 40-45% #5 CTA with left hemiplegia and left facial droop (chronic) #6 Complete heart block status post pacemaker.  #7 Type 2 diabetes  #8 Chronic kidney disease stage III  #9 Healthcare associated pneumonia  #10 Anemia of chronic disease, [employer]       PAST MEDICAL HISTORY: Past Medical History  Diagnosis Date  . Diabetes mellitus   . Hypertension   . CHF (congestive heart failure)   . CAD (coronary artery disease)   . Pacemaker   . [name] (transient ischemic attack)   . Stroke   . Hyperlipidemia   . Spinal stenosis   . DJD (degenerative joint disease)   . Peripheral vascular disease   . Hemorrhoids   . Carotid artery occlusion   . Dysphagia S/P CVA (cerebrovascular accident)     DISCHARGE MEDICATIONS: Current Discharge Medication List    START taking these medications   Details  bisacodyl (DULCOLAX) 10 MG suppository Place 1 suppository (10 mg total) rectally daily as needed for constipation (constipation). Qty: 12 suppository, Refills: 0    collagenase (SANTYL) ointment Apply topically daily. Qty: 15 g, Refills: 0    !! feeding supplement (ENSURE CLINICAL STRENGTH) LIQD Take 237 mLs by mouth 3 (three) times daily with meals. Qty: 60 Bottle, Refills: 2    feeding supplement (PRO-STAT SUGAR FREE 64) LIQD Take 30 mLs by mouth 2 (two) times daily. Qty: 900 mL, Refills: 2    levETIRAcetam (KEPPRA) 500 MG tablet Take 1 tablet (500 mg total) by mouth 2 (two) times daily. Qty:  60 tablet, Refills: 0    moxifloxacin (AVELOX) 400 MG tablet Take 1 tablet (400 mg total) by mouth daily. Qty: 8 tablet, Refills: 0     !! - Potential duplicate medications found. Please discuss with provider.    CONTINUE these medications which have CHANGED   Details  lisinopril (PRINIVIL,ZESTRIL) 2.5 MG tablet Take 1 tablet (2.5 mg total) by mouth daily. Qty: 30 tablet, Refills: 1      CONTINUE these medications which have NOT CHANGED   Details  aspirin 81 MG chewable tablet Chew 81 mg by mouth daily.     atorvastatin (LIPITOR) 20 MG tablet Take 20 mg by mouth at bedtime.     baclofen (LIORESAL) 10 MG tablet Take 1 tablet (10 mg total) by mouth 3 (three) times daily. Qty: 30 each    clopidogrel (PLAVIX) 75 MG tablet Take 75 mg by mouth daily.     finasteride (PROSCAR) 5 MG tablet Take 5 mg by mouth at bedtime.     insulin aspart (NOVOLOG) 100 UNIT/ML injection Inject 1-9 Units into the skin 3 (three) times daily with meals. Per sliding scale    insulin glargine (LANTUS) 100 UNIT/ML injection Inject 15 Units into the skin at bedtime.      metaxalone (SKELAXIN) 800 MG tablet Take 800 mg by mouth 3 (three) times daily as needed. For muscle spasms    mirtazapine (REMERON) 7.5 MG tablet Take 7.5 mg by mouth at bedtime.     Multiple Vitamins-Minerals (ICAPS) CAPS Take 1 capsule by mouth daily.     !!  protein supplement (PROSOURCE NO CARB) LIQD Take 30 mLs by mouth 2 (two) times daily.      spironolactone (ALDACTONE) 25 MG tablet Take 25 mg by mouth daily.      Tamsulosin HCl (FLOMAX) 0.4 MG CAPS Take 0.4 mg by mouth at bedtime.     zinc sulfate 220 MG capsule Take 220 mg by mouth every evening.     !! Nutritional Supplements (BOOST GLUCOSE CONTROL) LIQD Take 1 Can by mouth 3 (three) times daily with meals.      Sennosides (SENOKOT PO) Take 2 tablets by mouth daily as needed. For constipation     !! - Potential duplicate medications found. Please discuss with provider.      STOP taking these medications     cefUROXime (CEFTIN) 500 MG tablet      docusate sodium 100 MG CAPS      levofloxacin (LEVAQUIN) 500 MG tablet      carvedilol (COREG) 12.5 MG tablet      oxyCODONE-acetaminophen (PERCOCET) 5-325 MG per tablet          BRIEF HPI:  See H&P, Labs, Consult and Test reports for all details in brief, patient was admitted for altered mental status. Patient was found to be dehydrated in acute renal failure. Was admitted to hospitalist service for further evaluation and management of altered mental status. Neurology consult was obtained EEG was done and patient was empirically started on Keppra.  CONSULTATIONS:  Neurology consult.  PERTINENT RADIOLOGIC STUDIES: Ct Angio Head W/cm &/or Wo Cm  11/03/2011  *RADIOLOGY REPORT*  Clinical Data:  Question CVA.  The patient is unresponsive.  The patient received morphine the.  At the time of the exam, the patient had become responsive again.  CT ANGIOGRAPHY HEAD AND NECK  Technique:  Multidetector CT imaging of the head and neck was performed using the standard protocol during bolus administration of intravenous contrast.  Multiplanar CT image reconstructions including MIPs were obtained to evaluate the vascular anatomy. Carotid stenosis measurements (when applicable) are obtained utilizing NASCET criteria, using the distal internal carotid diameter as the denominator.  Contrast: 50mL OMNIPAQUE IOHEXOL 350 MG/ML IV SOLN  Comparison:  CT head without contrast 11/03/2011.  CTA NECK  Findings:  A standard three-vessel arch configuration is present. There is marked tortuosity of the innominate artery without significant stenosis.  Both vertebral arteries originate from the subclavian arteries. There is dense calcification at the origin of the nondominant right vertebral artery, suggesting a significant stenosis.  No other focal stenosis is present in the neck.  There are some atherosclerotic calcifications at the dural margin.   Marginal calcifications are present at the proximal left vertebral artery without significant stenosis.  There is a stenosis prior to the artery entering the vertebral canal.  This measures proximally 50%. A second stenosis is present at the level where the artery does anterior the foramen transversarium, also measuring approximately 50%.  The left vertebral artery is then within normal limits above that level.  It is the dominant vessel and to the head.  The right common carotid artery is tortuous, but without significant stenosis.  Atherosclerotic calcifications are present at the right carotid bifurcation.  There is no significant stenosis relative to the distal vessel.  The left common carotid artery is tortuous proximally, but without significant stenosis.  Calcified and noncalcified plaque is present in the proximal left internal carotid artery without significant stenosis.  The soft tissues of the neck are otherwise unremarkable.  The lung apices  are clear.  Degenerative changes are present throughout the cervical spine.   Review of the MIP images confirms the above findings.  IMPRESSION:  1.  Calcification at the origin of the right vertebral artery with a probable high-grade stenosis. 2.  Marginal calcification at the origin of the left vertebral artery without significant proximal stenosis. 3.  Tandem stenoses are present in the proximal left vertebral artery as the vessel enters the foramen transversarium. 4.  Atherosclerotic calcifications and noncalcified plaque at the carotid bifurcations bilaterally without significant stenosis. 5.  Moderate tortuosity of the innominate and left common carotid arteries.  CTA HEAD  Findings:  The source images demonstrate no acute cortical infarction.  The postcontrast images demonstrate no areas of pathologic enhancement. Moderate periventricular white matter hypoattenuation is compatible small vessel disease.  A remote lacunar infarct is noted in the right  cerebellum.  There are lacunar infarcts involving the right thalamus as well, likely remote.  The right cavernous carotid artery is heavily calcified.  The true lumen is narrowed to 2 mm. The distal lumen measures 2.4 mm.  Dense calcifications are noted within the left cavernous carotid artery as well.  The distal lumen is narrowed to 1 mm.  This compares with a more normal distal vessel of 3 mm.  The supraclinoid internal carotid arteries are unremarkable.  The A1 segments are normal bilaterally.  There is focal stenosis in the distal right M1 segment at the level of bifurcation.  There is irregularity in the distal left M1 segment.  A high-grade stenosis is present in the proximal inferior right M1 segment left M1 segment.  There is segmental stenoses in the more distal branches. Irregularities are noted throughout the ACA branches without a significant proximal stenosis or occlusion.  Multiple areas of segmental irregularity are present within the left MCA branch vessels without a significant proximal stenosis or occlusion.  The vertebral basilar junction is within normal limits.  The basilar artery is small and tortuous.  There is a 50% stenosis in the mid basilar segment.  Both posterior cerebral arteries originate from basilar tip.  There is significant segmental irregularity within and PCA branch vessels bilaterally.  The dural sinuses are patent.  No focal enhancement is seen.   Review of the MIP images confirms the above findings.  IMPRESSION:  1.  Moderate small vessel disease. 2.  Focal stenosis of the distal right M1 segment. 3.  Dense atherosclerotic calcifications involving the cavernous carotid arteries bilaterally with left greater than right moderate stenoses. 4.  50% stenosis of the basilar artery. 5.  Atherosclerotic calcifications at the dural margins of the vertebral arteries bilaterally. 6.  Diffuse white matter hypoattenuation, compatible with small vessel disease.  Original Report  Authenticated By: Jamesetta Orleans. MATTERN, M.D.   Dg Chest 1 View  11/12/2011  *RADIOLOGY REPORT*  Clinical Data: Assess for resolution of left lower lobe opacity  CHEST - 1 VIEW  Comparison: 11/09/2011  Findings: Minimal left basilar atelectasis, improved. No pleural effusion or pneumothorax.  Cardiomegaly.  Left subclavian pacemaker.  IMPRESSION: Minimal left basilar atelectasis, improved.  Cardiomegaly.  Original Report Authenticated By: Charline Bills, M.D.   Dg Chest 1 View  11/09/2011  *RADIOLOGY REPORT*  Clinical Data: Fever.  CHEST - 1 VIEW  Comparison: 11/04/2011  Findings: Low lung volumes are seen.  There is increased left retrocardiac opacity with nonvisualization of the left hemidiaphragm.  This may be due to left lower lobe atelectasis or infiltrate.  Mild atelectasis noted at right lung  base.  Mild cardiomegaly stable.  Dual lead transvenous pacemaker remains in appropriate position.  IMPRESSION: 1.  Atelectasis versus infiltrate in retrocardiac left lower lobe. 2.  Mild right basilar atelectasis.  Original Report Authenticated By: Danae Orleans, M.D.   Dg Chest 1 View  10/19/2011  *RADIOLOGY REPORT*  Clinical Data: Weakness.  CHEST - 1 VIEW  Comparison: Chest x-ray 09/22/2011.  Findings: The pacer wires are stable.  Mild cardiac enlargement. No acute pulmonary findings.  Left basilar scarring.  IMPRESSION:  1.  Stable cardiac enlargement. 2.  No acute pulmonary findings.  Original Report Authenticated By: P. Loralie Champagne, M.D.   Dg Hip Complete Left  10/19/2011  *RADIOLOGY REPORT*  Clinical Data: Left hip pain.  LEFT HIP - COMPLETE 2+ VIEW  Comparison: None  Findings: Both hips are normally located.  There are moderate degenerative changes.  The pubic symphysis and SI joints are intact.  No definite acute pelvic fractures.  IMPRESSION: Degenerative changes but no obvious acute fracture.  Original Report Authenticated By: P. Loralie Champagne, M.D.   Dg Hip Bilateral  Vito Berger  11/04/2011  *RADIOLOGY REPORT*  Clinical Data: Chronic bilateral hip pain and limited range of motion, status post fall.  BILATERAL HIP WITH PELVIS - 4+ VIEW  Comparison: CT of the abdomen and pelvis performed 09/20/2011  Findings: There is no evidence of fracture or dislocation.  Both femoral heads are seated normally within their respective acetabula.  Mild degenerative sclerotic change is seen at both hip joints, without evidence of significant joint space narrowing; there is mild degenerative change at the pubic symphysis.  The sacroiliac joints are unremarkable in appearance; mild degenerative change is noted along the lower lumbar spine.  The visualized bowel gas pattern is grossly unremarkable in appearance. Diffuse vascular calcifications are seen.  Contrast is noted within the bladder.  IMPRESSION:  1.  No evidence of fracture or dislocation. 2.  Mild degenerative change noted at both hip joints, without significant joint space narrowing; mild degenerative change at the lower lumbar spine. 3.  Diffuse vascular calcifications seen.  Original Report Authenticated By: Tonia Ghent, M.D.   Ct Head Wo Contrast  11/09/2011  *RADIOLOGY REPORT*  Clinical Data: Slurred speech.  Altered mental status.  Hypotension  CT HEAD WITHOUT CONTRAST  Technique:  Contiguous axial images were obtained from the base of the skull through the vertex without contrast.  Comparison: CT 11/03/2011  Findings: Generalized atrophy.  Chronic microvascular ischemic change in the white matter.  Chronic lacuna in the right putamen. Chronic infarct right cerebellum.  Chronic ischemia in the internal capsule on the right.  Negative for acute infarct.  Negative for hemorrhage or mass lesion.  Calvarium is intact.  IMPRESSION: Atrophy and chronic ischemic change.  No acute abnormality.  Original Report Authenticated By: Camelia Phenes, M.D.   Ct Head Wo Contrast  11/03/2011  *RADIOLOGY REPORT*  Clinical Data: Aphasia.  CT HEAD  WITHOUT CONTRAST  Technique:  Contiguous axial images were obtained from the base of the skull through the vertex without contrast.  Comparison: 10/25/2011.  Findings: No intracranial hemorrhage.  Remote right cerebellar infarct and right sub insular infarct. Prominent small vessel disease type changes. No CT evidence of large acute infarct.  Small acute infarct cannot be excluded by CT.  No intracranial mass lesion detected on this unenhanced exam.  Global atrophy without hydrocephalus.  Vascular calcifications.  IMPRESSION: No intracranial hemorrhage or CT evidence of large acute infarct.  Remote infarcts and small vessel disease  type changes.  Critical Value/emergent results were called by telephone at the time of interpretation on 11/03/2011  at 2:00 p.m.  to  [facility] who verbally acknowledged these results.  Original Report Authenticated By: Fuller Canada, M.D.   Ct Head Wo Contrast  10/25/2011  *RADIOLOGY REPORT*  Clinical Data: Dysphagia.  Code stroke.  Left-sided facial droop.  CT HEAD WITHOUT CONTRAST  Technique:  Contiguous axial images were obtained from the base of the skull through the vertex without contrast.  Comparison: 10/19/2011  Findings: The brainstem is unremarkable.  There is an old cerebellar infarction on the right.  The cerebral hemispheres show atrophy with extensive chronic small vessel change affecting the deep white matter.  No sign of acute infarction, mass lesion, hemorrhage, hydrocephalus or extra-axial collection.  There is atherosclerotic calcification of the major vessels at the base of the brain.  IMPRESSION: No acute or reversible findings.  Atrophy and chronic small vessel disease.  No appreciable change since 6 days ago.  Critical Value/emergent results were called by telephone at the time of interpretation on 10/25/2011  at 1850 hours  to   [facility], who verbally acknowledged these results.  Original Report Authenticated By: Thomasenia Sales,  M.D.   Ct Head Wo Contrast  10/19/2011  *RADIOLOGY REPORT*  Clinical Data: Left facial droop.  Renal failure and diabetes and hypertension.  CT HEAD WITHOUT CONTRAST  Technique:  Contiguous axial images were obtained from the base of the skull through the vertex without contrast.  Comparison: None.  Findings: Generalized atrophy.  Hypodensity in the periventricular white matter, typical for chronic microvascular ischemia.  No acute infarct.  Negative for hemorrhage or mass lesion.  Atherosclerotic disease in the carotid and vertebral arteries. Calvarium is intact.  IMPRESSION: Atrophy and chronic microvascular ischemia.  No acute abnormality.  Original Report Authenticated By: Camelia Phenes, M.D.   Ct Angio Neck W/cm &/or Wo/cm  11/03/2011  *RADIOLOGY REPORT*  Clinical Data:  Question CVA.  The patient is unresponsive.  The patient received morphine the.  At the time of the exam, the patient had become responsive again.  CT ANGIOGRAPHY HEAD AND NECK  Technique:  Multidetector CT imaging of the head and neck was performed using the standard protocol during bolus administration of intravenous contrast.  Multiplanar CT image reconstructions including MIPs were obtained to evaluate the vascular anatomy. Carotid stenosis measurements (when applicable) are obtained utilizing NASCET criteria, using the distal internal carotid diameter as the denominator.  Contrast: 50mL OMNIPAQUE IOHEXOL 350 MG/ML IV SOLN  Comparison:  CT head without contrast 11/03/2011.  CTA NECK  Findings:  A standard three-vessel arch configuration is present. There is marked tortuosity of the innominate artery without significant stenosis.  Both vertebral arteries originate from the subclavian arteries. There is dense calcification at the origin of the nondominant right vertebral artery, suggesting a significant stenosis.  No other focal stenosis is present in the neck.  There are some atherosclerotic calcifications at the dural margin.   Marginal calcifications are present at the proximal left vertebral artery without significant stenosis.  There is a stenosis prior to the artery entering the vertebral canal.  This measures proximally 50%. A second stenosis is present at the level where the artery does anterior the foramen transversarium, also measuring approximately 50%.  The left vertebral artery is then within normal limits above that level.  It is the dominant vessel and to the head.  The right common carotid artery is tortuous,  but without significant stenosis.  Atherosclerotic calcifications are present at the right carotid bifurcation.  There is no significant stenosis relative to the distal vessel.  The left common carotid artery is tortuous proximally, but without significant stenosis.  Calcified and noncalcified plaque is present in the proximal left internal carotid artery without significant stenosis.  The soft tissues of the neck are otherwise unremarkable.  The lung apices are clear.  Degenerative changes are present throughout the cervical spine.   Review of the MIP images confirms the above findings.  IMPRESSION:  1.  Calcification at the origin of the right vertebral artery with a probable high-grade stenosis. 2.  Marginal calcification at the origin of the left vertebral artery without significant proximal stenosis. 3.  Tandem stenoses are present in the proximal left vertebral artery as the vessel enters the foramen transversarium. 4.  Atherosclerotic calcifications and noncalcified plaque at the carotid bifurcations bilaterally without significant stenosis. 5.  Moderate tortuosity of the innominate and left common carotid arteries.  CTA HEAD  Findings:  The source images demonstrate no acute cortical infarction.  The postcontrast images demonstrate no areas of pathologic enhancement. Moderate periventricular white matter hypoattenuation is compatible small vessel disease.  A remote lacunar infarct is noted in the right  cerebellum.  There are lacunar infarcts involving the right thalamus as well, likely remote.  The right cavernous carotid artery is heavily calcified.  The true lumen is narrowed to 2 mm. The distal lumen measures 2.4 mm.  Dense calcifications are noted within the left cavernous carotid artery as well.  The distal lumen is narrowed to 1 mm.  This compares with a more normal distal vessel of 3 mm.  The supraclinoid internal carotid arteries are unremarkable.  The A1 segments are normal bilaterally.  There is focal stenosis in the distal right M1 segment at the level of bifurcation.  There is irregularity in the distal left M1 segment.  A high-grade stenosis is present in the proximal inferior right M1 segment left M1 segment.  There is segmental stenoses in the more distal branches. Irregularities are noted throughout the ACA branches without a significant proximal stenosis or occlusion.  Multiple areas of segmental irregularity are present within the left MCA branch vessels without a significant proximal stenosis or occlusion.  The vertebral basilar junction is within normal limits.  The basilar artery is small and tortuous.  There is a 50% stenosis in the mid basilar segment.  Both posterior cerebral arteries originate from basilar tip.  There is significant segmental irregularity within and PCA branch vessels bilaterally.  The dural sinuses are patent.  No focal enhancement is seen.   Review of the MIP images confirms the above findings.  IMPRESSION:  1.  Moderate small vessel disease. 2.  Focal stenosis of the distal right M1 segment. 3.  Dense atherosclerotic calcifications involving the cavernous carotid arteries bilaterally with left greater than right moderate stenoses. 4.  50% stenosis of the basilar artery. 5.  Atherosclerotic calcifications at the dural margins of the vertebral arteries bilaterally. 6.  Diffuse white matter hypoattenuation, compatible with small vessel disease.  Original Report  Authenticated By: Jamesetta Orleans. MATTERN, M.D.   Ct Lumbar Spine Wo Contrast  10/20/2011  *RADIOLOGY REPORT*  Clinical Data: 77 year old male with radiculopathy, possible lumbar stenosis.  CT LUMBAR SPINE WITHOUT CONTRAST  Technique:  Multidetector CT imaging of the lumbar spine was performed without intravenous contrast administration. Multiplanar CT image reconstructions were also generated.  Comparison: CT abdomen and pelvis 09/20/2011.  Findings: Cardiac pacemaker leads partially visualized.  Stable visualized abdominal viscera including extensive calcified atherosclerosis, exophytic left renal cyst, and there has been some increase in the volume of retained stool in the colon.  Prior surgery to the L3-L4 and L4-L5 levels, see details below. Bone mineralization is within normal limits for age.  Degenerative ankylosis of the left SI joint.  Visualized sacrum is intact.  T11-T12:  Right far lateral disc osteophyte complex.  Mild ligament flavum hypertrophy.  No spinal or foraminal stenosis.  T12-L1:  Mild calcified circumferential disc bulge.  No spinal or foraminal stenosis.  L1-L2:  Mild disc bulge.  Moderate facet hypertrophy greater on the right.  Bilateral vacuum facet phenomena.  No spinal stenosis. Mild right L1 foraminal stenosis.  L2-L3:  Mild to moderate circumferential disc bulge.  Moderate facet hypertrophy.  Vacuum facet phenomenon greater on the left. No spinal stenosis.  Mild bilateral L2 foraminal stenosis.  L3-L4:  Sequelae of decompression.  Severe residual facet hypertrophy. Vacuum facet hypertrophy on the left.  Circumferential disc bulge.  Ligament flavum hypertrophy greater on the right.  No spinal stenosis, but a degree of bilateral lateral recess stenosis is suspected.  There is also severe left and moderate right bilateral L3 foraminal stenosis.  L4-L5:  Sequelae of decompression.  Anterolisthesis measuring 4-5 mm.  Very severe residual facet hypertrophy.  Circumferential disc  osteophyte complex.  No spinal stenosis, but there could be a bilateral lateral recess stenosis.  There is severe bilateral L4 foraminal stenosis, worse on the left.  L5-S1:  Circumferential disc bulge.  Mild facet hypertrophy.  No spinal stenosis.  Moderate left and severe right L5 foraminal stenosis.  IMPRESSION: 1.  Previous decompression at L3-L4 and L4-L5.  - At L4-L5 there is grade 1 spondylolisthesis with very severe residual facet hypertrophy and multifactorial severe bilateral L4 foraminal stenosis and possible lateral recess stenosis. - At L3-L4 there is residual facet is and degeneration with moderate to severe bilateral L3 foraminal stenosis and possible lateral recess stenosis. 2. Moderate to severe multifactorial bilateral L5 foraminal stenosis. 3.  No lumbar spinal stenosis.  Comparatively mild for age disc and facet degeneration elsewhere in the lumbar spine.  Original Report Authenticated By: Harley Hallmark, M.D.   Dg Chest Portable 1 View  11/04/2011  *RADIOLOGY REPORT*  Clinical Data: Cough, aspiration  PORTABLE CHEST - 1 VIEW  Comparison: Chest radiograph 11/03/2011  Findings: Left-sided pacemaker overlies stable enlarged heart silhouette.  Small bilateral pleural effusions are present.  No pulmonary edema.  No pneumothorax.  IMPRESSION: No significant change.  Original Report Authenticated By: Genevive Bi, M.D.   Dg Chest Portable 1 View  11/03/2011  *RADIOLOGY REPORT*  Clinical Data: Chest pain.  PORTABLE CHEST - 1 VIEW  Comparison: Chest x-ray 10/25/2011.  Findings: The pacer wires are stable.  Mild stable cardiac enlargement.  Streaky bibasilar atelectasis and possible small left effusion.  No edema or pneumothorax.  IMPRESSION: Low lung volumes with streaky bibasilar atelectasis and possible small left effusion.  Original Report Authenticated By: P. Loralie Champagne, M.D.   Dg Chest Portable 1 View  10/25/2011  *RADIOLOGY REPORT*  Clinical Data: Shortness of breath, weakness and  confusion.  PORTABLE CHEST - 1 VIEW  Comparison: Chest x-ray 10/19/2011.  Findings: The heart is enlarged but stable.  The pacer wires are stable.  There is central vascular congestion without overt to pulmonary edema.  No pleural effusions or focal infiltrates.  Bony thorax is intact.  IMPRESSION: Cardiac enlargement and  vascular congestion without overt pulmonary edema.  Original Report Authenticated By: P. Loralie Champagne, M.D.   Dg Foot 2 Views Left  11/06/2011  *RADIOLOGY REPORT*  Clinical Data: Heel wound  LEFT FOOT - 2 VIEW  Comparison: 09/25/2011  Findings: Negative for fracture.  Negative for osteomyelitis of the calcaneus.  Spurring of the calcaneus is present.  There is advanced arterial calcification.  IMPRESSION: Negative for fracture or osteomyelitis of the calcaneus.  Original Report Authenticated By: Camelia Phenes, M.D.     PERTINENT LAB RESULTS: CBC:  Basename 11/14/11 0712 11/13/11 2041 11/13/11 0620  WBC 10.5 -- 11.2*  HGB 7.0* 7.0* --  HCT 22.1* 21.7* --  PLT 227 -- 232   CMET CMP     Component Value Date/Time   NA 143 11/13/2011 0620   K 4.0 11/13/2011 0620   CL 109 11/13/2011 0620   CO2 27 11/13/2011 0620   GLUCOSE 134* 11/13/2011 0620   BUN 21 11/13/2011 0620   CREATININE 1.28 11/13/2011 0620   CALCIUM 8.2* 11/13/2011 0620   PROT 6.6 11/03/2011 1419   ALBUMIN 2.1* 11/03/2011 1419   AST 45* 11/03/2011 1419   ALT 41 11/03/2011 1419   ALKPHOS 136* 11/03/2011 1419   BILITOT 0.4 11/03/2011 1419   GFRNONAA 50* 11/13/2011 0620   GFRAA 58* 11/13/2011 0620    GFR Estimated Creatinine Clearance: 45.9 ml/min (by C-G formula based on Cr of 1.28). No results found for this basename: LIPASE:2,AMYLASE:2 in the last 72 hours No results found for this basename: CKTOTAL:3,CKMB:3,CKMBINDEX:3,TROPONINI:3 in the last 72 hours No results found for this basename: POCBNP:3 in the last 72 hours No results found for this basename: DDIMER:2 in the last 72 hours No results found for  this basename: HGBA1C:2 in the last 72 hours No results found for this basename: CHOL:2,HDL:2,LDLCALC:2,TRIG:2,CHOLHDL:2,LDLDIRECT:2 in the last 72 hours No results found for this basename: TSH,T4TOTAL,FREET3,T3FREE,THYROIDAB in the last 72 hours  Basename 11/13/11 2041  VITAMINB12 414  FOLATE 5.3  FERRITIN 675*  TIBC 101*  IRON 21*  RETICCTPCT 1.2   Coags: No results found for this basename: PT:2,INR:2 in the last 72 hours Microbiology: Recent Results (from the past 240 hour(s))  CULTURE, BLOOD (ROUTINE X 2)     Status: Normal   Collection Time   11/04/11  1:46 PM      Component Value Range Status Comment   Specimen Description BLOOD ARM RIGHT   Final    Special Requests BOTTLES DRAWN AEROBIC AND ANAEROBIC 10CC EACH   Final    Setup Time [id number]   Final    Culture NO GROWTH 5 DAYS   Final    Report Status 11/10/2011 FINAL   Final   CULTURE, BLOOD (ROUTINE X 2)     Status: Normal   Collection Time   11/04/11  2:00 PM      Component Value Range Status Comment   Specimen Description BLOOD HAND RIGHT   Final    Special Requests BOTTLES DRAWN AEROBIC ONLY 10CC BLUE   Final    Setup Time [id number]   Final    Culture NO GROWTH 5 DAYS   Final    Report Status 11/10/2011 FINAL   Final   CULTURE, BLOOD (ROUTINE X 2)     Status: Normal (Preliminary result)   Collection Time   11/09/11 11:10 AM      Component Value Range Status Comment   Specimen Description BLOOD LEFT ARM   Final    Special Requests BOTTLES DRAWN  AEROBIC AND ANAEROBIC 10CC   Final    Setup Time [id number]   Final    Culture     Final    Value:        BLOOD CULTURE RECEIVED NO GROWTH TO DATE CULTURE WILL BE HELD FOR 5 DAYS BEFORE ISSUING A FINAL NEGATIVE REPORT   Report Status PENDING   Incomplete   CULTURE, BLOOD (ROUTINE X 2)     Status: Normal (Preliminary result)   Collection Time   11/09/11 11:49 AM      Component Value Range Status Comment   Specimen Description BLOOD LEFT HAND   Final    Special  Requests BOTTLES DRAWN AEROBIC ONLY 3CC   Final    Setup Time [id number]   Final    Culture     Final    Value:        BLOOD CULTURE RECEIVED NO GROWTH TO DATE CULTURE WILL BE HELD FOR 5 DAYS BEFORE ISSUING A FINAL NEGATIVE REPORT   Report Status PENDING   Incomplete      BRIEF HOSPITAL COURSE:  1. Metabolic encephalopathy: Altered mental status most likely secondary to benzodiazepine overdose he was also found to be dehydrated.  Recommend to Avoid sedatives. Neurology consult was obtained and recommended EEG. EEG was normal. He was empirically started on Keppra and he's been discharged on a trial of Keppra. Currently his mental status is at his baseline.  2. healthcare associated pneumonia: Patient was found to have a left lower lobe pneumonia on chest x-ray with elevated white count and fevers 101. He'll start broad-spectrum antibiotics IV vancomycin/zosyn/Levaquin. During the course of his hospitalization his symptoms improved his leukocytosis resolved his antibiotics were Descalated to by mouth Avelox and he is being discharged on  Avelox for another 8 days to complete two-week course of antibiotics. 3. acute renal failure resolved 4. chronic systolic heart failure patient was compensated all through his hospitalization. Surprisingly the patient was not on Lasix on admission and he was on spirono lactone and he's been discharged on the same. 5. hypertension patient had few episodes of hypotension with bradycardia and he was given boluses of IV fluids, his lisinopril was decreased to 2.5 mg daily and his coreg was further decreased to 3.125 milligrams twice a day he is recommended to see Dr. Anne Fu his  cardiologist in one to 2 weeks of time 6. 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 His H&H was monitored serially for the next 24 hours as been stable at 7. 9. Diabetes mellitus continue with sliding scale insulin. 11. chronic left heel decubitus ulcer secondary to peripheral vascular disease, going over the previous notes he was found not to be a surgical candidate for revascularization. But recommend vascular surgery followup as outpatient.  Spoke with the daughter over the phone up dated and discussed the plan.   TODAY-DAY OF DISCHARGE:  Subjective:   [name] today has no headache,no chest abdominal pain, wants to go home Objective:   Blood pressure 118/71, pulse 65, temperature 98.5 F (36.9 C), temperature source Oral, resp. rate 20, height 5\' 10"  (1.778 m), weight 78.2 kg (172 lb 6.4 oz), SpO2 100.00%.  Intake/Output Summary (Last 24 hours) at 11/14/11 1256 Last data filed at 11/14/11 0932  Gross per 24  hour  Intake   1244 ml  Output      0 ml  Net   1244 ml    Exam Awake Alert, comfortable, No new F.N deficits, Normal affect Hiram.AT,[name] Supple Neck,No JVD,   Symmetrical Chest wall movement, Good air movement bilaterally, CTAB RRR,, No Parasternal Heave +ve B.Sounds, Abd Soft, Non tender, No organomegaly appriciated, No rebound -guarding or rigidity. Extremities: left foot ulcer , b/f feel in splint boots.  No new Rash or bruise  DISPOSITION:  Discharge patient to SNF when bed L. [place] Discussed the plan and updated the patient's condition with the patient's daughter Ms. Erie Noe.  DISCHARGE INSTRUCTIONS:    Follow-up Information    Follow up with vascular surgery  in 1 week.      Follow up with Donato Schultz, MD in 1 week. (to see if he needed to be started on lasix. )    Contact information:   [address] [place] [address] [phone]         Follow up with PCP IN 1 TO  2 WEEKS.   Total Time spent on discharge equals 45  minutes.  SignedKathlen Mody 11/14/2011 12:56 PM

## 2011-11-15 LAB — CULTURE, BLOOD (ROUTINE X 2)
Culture  Setup Time: 201211151806
Culture: NO GROWTH

## 2011-11-21 ENCOUNTER — Encounter: Payer: Self-pay | Admitting: Vascular Surgery

## 2011-11-22 ENCOUNTER — Encounter: Payer: Self-pay | Admitting: Vascular Surgery

## 2011-11-22 ENCOUNTER — Encounter (HOSPITAL_COMMUNITY): Payer: Self-pay | Admitting: Pharmacy Technician

## 2011-11-22 ENCOUNTER — Ambulatory Visit (INDEPENDENT_AMBULATORY_CARE_PROVIDER_SITE_OTHER): Payer: Medicare Other | Admitting: Vascular Surgery

## 2011-11-22 ENCOUNTER — Other Ambulatory Visit: Payer: Self-pay | Admitting: *Deleted

## 2011-11-22 VITALS — BP 138/68 | HR 77 | Temp 98.0°F | Resp 16 | Ht 70.0 in | Wt 175.0 lb

## 2011-11-22 DIAGNOSIS — I739 Peripheral vascular disease, unspecified: Secondary | ICD-10-CM

## 2011-11-22 DIAGNOSIS — L98499 Non-pressure chronic ulcer of skin of other sites with unspecified severity: Secondary | ICD-10-CM

## 2011-11-22 NOTE — Progress Notes (Signed)
[facility]  Patient name: [name] MRN: [id number] DOB: [birth year] Sex: male  CC: Follow up of left heel ulcer  HPI: [name] is a 76 y.o. male who I had originally seen with a left heel ulcer in consultation on 10/25/2011. He was set up for an arteriogram, however this had to be canceled because he was quite ill. The patient ultimately was readmitted with a TIA and seizure. He has recovered from this most recent illness. He is here to discuss rescheduling his arteriogram. His activity is very limited and I do not get any history of claudication.  Past Medical History  Diagnosis Date  . Diabetes mellitus   . Hypertension   . CHF (congestive heart failure)   . CAD (coronary artery disease)   . Pacemaker   . TIA (transient ischemic attack)   . Stroke   . Hyperlipidemia   . Spinal stenosis   . DJD (degenerative joint disease)   . Peripheral vascular disease   . Hemorrhoids   . Carotid artery occlusion   . Dysphagia S/P CVA (cerebrovascular accident)   . Seizure disorder     History reviewed. No pertinent family history.  SOCIAL HISTORY: History  Substance Use Topics  . Smoking status: Former Games developer  . Smokeless tobacco: Not on file  . Alcohol Use: No    No Known Allergies  Current Outpatient Prescriptions  Medication Sig Dispense Refill  . aspirin 81 MG chewable tablet Chew 81 mg by mouth daily.       Marland Kitchen atorvastatin (LIPITOR) 20 MG tablet Take 20 mg by mouth at bedtime.       . carvedilol (COREG) 12.5 MG tablet Take 12.5 mg by mouth 2 (two) times daily with a meal.        . clopidogrel (PLAVIX) 75 MG tablet Take 75 mg by mouth daily.       . collagenase (SANTYL) ointment Apply topically daily.  15 g  0  . finasteride (PROSCAR) 5 MG tablet Take 5 mg by mouth at bedtime.       . insulin aspart (NOVOLOG) 100 UNIT/ML injection Inject 1-9 Units into the skin 3 (three) times daily with meals. Per sliding scale      . insulin  glargine (LANTUS) 100 UNIT/ML injection Inject 15 Units into the skin at bedtime.        Marland Kitchen lisinopril (PRINIVIL,ZESTRIL) 2.5 MG tablet Take 5 mg by mouth daily.        . metaxalone (SKELAXIN) 800 MG tablet Take 800 mg by mouth 3 (three) times daily as needed. For muscle spasms      . mirtazapine (REMERON) 7.5 MG tablet Take 7.5 mg by mouth at bedtime.       . Multiple Vitamins-Minerals (ICAPS) CAPS Take 1 capsule by mouth daily.       Marland Kitchen oxyCODONE-acetaminophen (PERCOCET) 5-325 MG per tablet Take 1 tablet by mouth every 4 (four) hours as needed.        . protein supplement (PROSOURCE NO CARB) LIQD Take 30 mLs by mouth 2 (two) times daily.        . Sennosides (SENOKOT PO) Take 2 tablets by mouth daily as needed. For constipation      . spironolactone (ALDACTONE) 25 MG tablet Take 25 mg by mouth daily.        . Tamsulosin HCl (FLOMAX) 0.4 MG CAPS Take 0.4 mg by mouth at bedtime.       Marland Kitchen zinc sulfate 220 MG  capsule Take 220 mg by mouth every evening.       . bisacodyl (DULCOLAX) 10 MG suppository Place 1 suppository (10 mg total) rectally daily as needed for constipation (constipation).  12 suppository  0  . feeding supplement (ENSURE CLINICAL STRENGTH) LIQD Take 237 mLs by mouth 3 (three) times daily with meals.  60 Bottle  2  . feeding supplement (PRO-STAT SUGAR FREE 64) LIQD Take 30 mLs by mouth 2 (two) times daily.  900 mL  2  . levETIRAcetam (KEPPRA) 500 MG tablet Take 1 tablet (500 mg total) by mouth 2 (two) times daily.  60 tablet  0  . moxifloxacin (AVELOX) 400 MG tablet Take 1 tablet (400 mg total) by mouth daily.  8 tablet  0  . Nutritional Supplements (BOOST GLUCOSE CONTROL) LIQD Take 1 Can by mouth 3 (three) times daily with meals.          REVIEW OF SYSTEMS: Arly.Keller ] denotes positive finding; [  ] denotes negative finding CARDIOVASCULAR:  [ ]  chest pain   [ ]  chest pressure   [ ]  palpitations   [ ]  orthopnea   [ ]  dyspnea on exertion   [ ]  claudication   [ ]  rest pain   [ ]  DVT   [ ]   phlebitis PULMONARY:   [ ]  productive cough   [ ]  asthma   [ ]  wheezing  PHYSICAL EXAM: Filed Vitals:   11/22/11 0902  BP: 138/68  Pulse: 77  Temp: 98 F (36.7 C)  TempSrc: Oral  Resp: 16  Height: 5\' 10"  (1.778 m)  Weight: 175 lb (79.379 kg)  SpO2: 100%   Body mass index is 25.11 kg/(m^2). GENERAL: The patient is a well-nourished male, in no acute distress. The vital signs are documented above. CARDIOVASCULAR: There is a regular rate and rhythm without significant murmur appreciated.  PULMONARY: There is good air exchange bilaterally without wheezing or rales. His left heel decubitus is unchanged. This is dry without erythema or drainage.  MEDICAL ISSUES: Patient has a nonhealing left heel decubitus. Currently this is dry without cellulitis or drainage. He is a poor candidate for revascularization. 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 I've recommended that we proceed with his arteriogram to further assess the circulation of the left leg. The circulation is reasonable consideration could be given to debridement of the decubitus with placement of a VAC. If he has severe tibial artery occlusive disease without options for revascularization it would probably be best to proceed with primary amputation if the wound progressed. We will make further recommendations pending the results of his arteriogram. I previously discussed the procedure and risks with the patient. His arteriogram is scheduled for 11/27/2011.  [name] S [facility] [place] Office: [phone]

## 2011-11-24 ENCOUNTER — Other Ambulatory Visit: Payer: Self-pay

## 2011-11-27 ENCOUNTER — Ambulatory Visit (HOSPITAL_COMMUNITY)
Admission: RE | Admit: 2011-11-27 | Discharge: 2011-11-27 | Disposition: A | Discharge status: Home / Self Care | Payer: Medicare Other | Source: Ambulatory Visit | Attending: Vascular Surgery | Admitting: Vascular Surgery

## 2011-11-27 ENCOUNTER — Encounter (HOSPITAL_COMMUNITY)
Admission: RE | Disposition: A | Discharge status: Home / Self Care | Payer: Self-pay | Source: Ambulatory Visit | Attending: Vascular Surgery

## 2011-11-27 DIAGNOSIS — L89609 Pressure ulcer of unspecified heel, unspecified stage: Secondary | ICD-10-CM | POA: Insufficient documentation

## 2011-11-27 DIAGNOSIS — I70269 Atherosclerosis of native arteries of extremities with gangrene, unspecified extremity: Secondary | ICD-10-CM

## 2011-11-27 DIAGNOSIS — L8990 Pressure ulcer of unspecified site, unspecified stage: Secondary | ICD-10-CM | POA: Insufficient documentation

## 2011-11-27 DIAGNOSIS — I743 Embolism and thrombosis of arteries of the lower extremities: Secondary | ICD-10-CM | POA: Insufficient documentation

## 2011-11-27 DIAGNOSIS — I96 Gangrene, not elsewhere classified: Secondary | ICD-10-CM | POA: Insufficient documentation

## 2011-11-27 HISTORY — PX: INTRAOPERATIVE ARTERIOGRAM: SHX5157

## 2011-11-27 HISTORY — PX: ABDOMINAL AORTAGRAM: SHX5454

## 2011-11-27 SURGERY — ABDOMINAL AORTAGRAM
Anesthesia: LOCAL

## 2011-11-27 MED ORDER — LIDOCAINE HCL (PF) 1 % IJ SOLN
INTRAMUSCULAR | Status: AC
  Filled 2011-11-27: qty 30

## 2011-11-27 MED ORDER — ACETAMINOPHEN 325 MG PO TABS: 650.0000 mg | ORAL_TABLET | ORAL | Status: DC | PRN

## 2011-11-27 MED ORDER — LABETALOL HCL 5 MG/ML IV SOLN
INTRAVENOUS | Status: AC
  Filled 2011-11-27: qty 4

## 2011-11-27 MED ORDER — OXYCODONE-ACETAMINOPHEN 5-325 MG PO TABS
ORAL_TABLET | ORAL | Status: AC
  Filled 2011-11-27: qty 2

## 2011-11-27 MED ORDER — ONDANSETRON HCL 4 MG/2ML IJ SOLN: 4.0000 mg | Freq: Four times a day (QID) | INTRAMUSCULAR | Status: DC | PRN

## 2011-11-27 MED ORDER — SODIUM CHLORIDE 0.9 % IV SOLN: 1.0000 mL/kg/h | INTRAVENOUS | Status: DC

## 2011-11-27 MED ORDER — OXYCODONE-ACETAMINOPHEN 5-325 MG PO TABS
1.0000 | ORAL_TABLET | ORAL | Status: DC | PRN
  Administered 2011-11-27: 2 via ORAL

## 2011-11-27 MED ORDER — SODIUM CHLORIDE 0.9 % IV SOLN
INTRAVENOUS | Status: DC
  Administered 2011-11-27: 09:00:00 via INTRAVENOUS

## 2011-11-27 MED ORDER — HEPARIN (PORCINE) IN NACL 2-0.9 UNIT/ML-% IJ SOLN
INTRAMUSCULAR | Status: AC
  Filled 2011-11-27: qty 1000

## 2011-11-27 NOTE — Op Note (Signed)
11/27/2011  PREOP DIAGNOSIS: Gangrenous wound of left heel  POSTOP DIAGNOSIS: Same  PROCEDURE:  1. Ultrasound-guided access to right common femoral artery 2. Selective catheterization of left external iliac artery. 3. Left lower extremity arteriogram   SURGEON: Di Kindle. Edilia Bo, MD, FACS  ANESTHESIA: local   EBL: minimal  INDICATIONS: this is a 77 year old gentleman who presented with dry gangrene of his left heel. He is brought in for diagnostic arteriography.  TECHNIQUE: The patient was brought to the peripheral vascular lab. After careful positioning the right groin was prepped and draped in the usual sterile fashion. Of note he had significant contractures. After the skin was anesthetized with 1% lidocaine and under ultrasound guidance, the right common femoral artery was cannulated and a guidewire introduced into the superior vena cava under fluoroscopic control. 5 French sheath was introduced over the wire. A crossover catheter was used to cannulate the left common iliac artery. An angled Glidewire was directed into the external iliac artery on the left. A 4 French endhole catheter was placed over the wire. Selective left external iliac arteriogram was obtained. At the completion of the procedure the sheath was removed and pressure held for hemostasis. No immediate complications were noted.  FINDINGS:  1. The common iliac, hypogastric, and external iliac artery on the left are patent. The left common femoral superficial femoral and deep femoral arteries are patent. Popped artery is patent. The proximal tibial vessels are occluded. Her is reconstitution of a short segment disease dorsalis pedis artery at the ankle.       Waverly Ferrari, MD, FACS [facility] [place]  DATE OF OPERATION: 11/27/2011 DATE OF DICTATION: 11/27/2011

## 2011-11-27 NOTE — Interval H&P Note (Signed)
History and Physical Interval Note:  11/27/2011 10:41 AM  [name]  has presented today for an angiogram, with the diagnosis of PVD  The various methods of treatment have been discussed with the patient and family. After consideration of risks, benefits and other options for treatment, the patient has consented to  Procedure(s): ABDOMINAL AORTAGRAM.  The patients' history has been reviewed, patient examined, no change in status, stable for surgery.  I have reviewed the patients' chart and labs.  Questions were answered to the patient's satisfaction.     [name]

## 2011-11-27 NOTE — H&P (View-Only) (Signed)
[facility] Specialist of [place]  Patient name: [name] MRN: [id number] DOB: 11/16/1927 Sex: male  CC: Follow up of left heel ulcer  HPI: [name] is a 77 y.o. male who I had originally seen with a left heel ulcer in consultation on 10/25/2011. He was set up for an arteriogram, however this had to be canceled because he was quite ill. The patient ultimately was readmitted with a TIA and seizure. He has recovered from this most recent illness. He is here to discuss rescheduling his arteriogram. His activity is very limited and I do not get any history of claudication.  Past Medical History  Diagnosis Date  . Diabetes mellitus   . Hypertension   . CHF (congestive heart failure)   . CAD (coronary artery disease)   . Pacemaker   . TIA (transient ischemic attack)   . Stroke   . Hyperlipidemia   . Spinal stenosis   . DJD (degenerative joint disease)   . Peripheral vascular disease   . Hemorrhoids   . Carotid artery occlusion   . Dysphagia S/P CVA (cerebrovascular accident)   . Seizure disorder     History reviewed. No pertinent family history.  SOCIAL HISTORY: History  Substance Use Topics  . Smoking status: Former Smoker  . Smokeless tobacco: Not on file  . Alcohol Use: No    No Known Allergies  Current Outpatient Prescriptions  Medication Sig Dispense Refill  . aspirin 81 MG chewable tablet Chew 81 mg by mouth daily.       . atorvastatin (LIPITOR) 20 MG tablet Take 20 mg by mouth at bedtime.       . carvedilol (COREG) 12.5 MG tablet Take 12.5 mg by mouth 2 (two) times daily with a meal.        . clopidogrel (PLAVIX) 75 MG tablet Take 75 mg by mouth daily.       . collagenase (SANTYL) ointment Apply topically daily.  15 g  0  . finasteride (PROSCAR) 5 MG tablet Take 5 mg by mouth at bedtime.       . insulin aspart (NOVOLOG) 100 UNIT/ML injection Inject 1-9 Units into the skin 3 (three) times daily with meals. Per sliding scale      . insulin  glargine (LANTUS) 100 UNIT/ML injection Inject 15 Units into the skin at bedtime.        . lisinopril (PRINIVIL,ZESTRIL) 2.5 MG tablet Take 5 mg by mouth daily.        . metaxalone (SKELAXIN) 800 MG tablet Take 800 mg by mouth 3 (three) times daily as needed. For muscle spasms      . mirtazapine (REMERON) 7.5 MG tablet Take 7.5 mg by mouth at bedtime.       . Multiple Vitamins-Minerals (ICAPS) CAPS Take 1 capsule by mouth daily.       . oxyCODONE-acetaminophen (PERCOCET) 5-325 MG per tablet Take 1 tablet by mouth every 4 (four) hours as needed.        . protein supplement (PROSOURCE NO CARB) LIQD Take 30 mLs by mouth 2 (two) times daily.        . Sennosides (SENOKOT PO) Take 2 tablets by mouth daily as needed. For constipation      . spironolactone (ALDACTONE) 25 MG tablet Take 25 mg by mouth daily.        . Tamsulosin HCl (FLOMAX) 0.4 MG CAPS Take 0.4 mg by mouth at bedtime.       . zinc sulfate 220 MG   capsule Take 220 mg by mouth every evening.       . bisacodyl (DULCOLAX) 10 MG suppository Place 1 suppository (10 mg total) rectally daily as needed for constipation (constipation).  12 suppository  0  . feeding supplement (ENSURE CLINICAL STRENGTH) LIQD Take 237 mLs by mouth 3 (three) times daily with meals.  60 Bottle  2  . feeding supplement (PRO-STAT SUGAR FREE 64) LIQD Take 30 mLs by mouth 2 (two) times daily.  900 mL  2  . levETIRAcetam (KEPPRA) 500 MG tablet Take 1 tablet (500 mg total) by mouth 2 (two) times daily.  60 tablet  0  . moxifloxacin (AVELOX) 400 MG tablet Take 1 tablet (400 mg total) by mouth daily.  8 tablet  0  . Nutritional Supplements (BOOST GLUCOSE CONTROL) LIQD Take 1 Can by mouth 3 (three) times daily with meals.          REVIEW OF SYSTEMS: [X ] denotes positive finding; [  ] denotes negative finding CARDIOVASCULAR:  [ ] chest pain   [ ] chest pressure   [ ] palpitations   [ ] orthopnea   [ ] dyspnea on exertion   [ ] claudication   [ ] rest pain   [ ] DVT   [ ]  phlebitis PULMONARY:   [ ] productive cough   [ ] asthma   [ ] wheezing  PHYSICAL EXAM: Filed Vitals:   11/22/11 0902  BP: 138/68  Pulse: 77  Temp: 98 F (36.7 C)  TempSrc: Oral  Resp: 16  Height: 5' 10" (1.778 m)  Weight: 175 lb (79.379 kg)  SpO2: 100%   Body mass index is 25.11 kg/(m^2). GENERAL: The patient is a well-nourished male, in no acute distress. The vital signs are documented above. CARDIOVASCULAR: There is a regular rate and rhythm without significant murmur appreciated.  PULMONARY: There is good air exchange bilaterally without wheezing or rales. His left heel decubitus is unchanged. This is dry without erythema or drainage.  MEDICAL ISSUES: Patient has a nonhealing left heel decubitus. Currently this is dry without cellulitis or drainage. He is a poor candidate for revascularization. 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 I've recommended that we proceed with his arteriogram to further assess the circulation of the left leg. The circulation is reasonable consideration could be given to debridement of the decubitus with placement of a VAC. If he has severe tibial artery occlusive disease without options for revascularization it would probably be best to proceed with primary amputation if the wound progressed. We will make further recommendations pending the results of his arteriogram. I previously discussed the procedure and risks with the patient. His arteriogram is scheduled for 11/27/2011.  Oziah Vitanza S [facility]  Office: [phone]    

## 2011-11-28 LAB — POCT I-STAT, CHEM 8
BUN: 10 mg/dL (ref 6–23)
Calcium, Ion: 1.09 mmol/L — ABNORMAL LOW (ref 1.12–1.32)
Chloride: 109 mEq/L (ref 96–112)
Glucose, Bld: 75 mg/dL (ref 70–99)

## 2011-12-25 ENCOUNTER — Encounter: Payer: Self-pay | Admitting: Vascular Surgery

## 2011-12-27 ENCOUNTER — Ambulatory Visit: Payer: Medicare Other | Admitting: Vascular Surgery

## 2012-01-02 ENCOUNTER — Encounter: Payer: Self-pay | Admitting: Vascular Surgery

## 2012-01-03 ENCOUNTER — Encounter: Payer: Self-pay | Admitting: Vascular Surgery

## 2012-01-03 ENCOUNTER — Ambulatory Visit (INDEPENDENT_AMBULATORY_CARE_PROVIDER_SITE_OTHER): Payer: Medicare Other | Admitting: Vascular Surgery

## 2012-01-03 VITALS — BP 140/80 | HR 76 | Temp 98.1°F

## 2012-01-03 DIAGNOSIS — L98499 Non-pressure chronic ulcer of skin of other sites with unspecified severity: Secondary | ICD-10-CM

## 2012-01-03 NOTE — Progress Notes (Signed)
[facility]  Patient name: [name] MRN: [id number] DOB: 27-Feb-1927 Sex: male  REASON FOR VISIT: Unable to obtain history from patient as he is lethargic. He did not appear to be in any pain.  HPI: [name] is a 78 y.o. male who I had seen with a left heel decubitus. He underwent an arteriogram which showed severe tibial artery occlusive disease. He was not a candidate for revascularization. He comes in for a routine follow up visit. The nurse with him he states that his best that she is aware there has not been any fever. He's not had any significant drainage from the wound.  Past Medical History  Diagnosis Date  . Diabetes mellitus   . Hypertension   . CHF (congestive heart failure)   . CAD (coronary artery disease)   . Pacemaker   . TIA (transient ischemic attack)   . Stroke   . Hyperlipidemia   . Spinal stenosis   . DJD (degenerative joint disease)   . Peripheral vascular disease   . Hemorrhoids   . Carotid artery occlusion   . Dysphagia S/P CVA (cerebrovascular accident)   . Seizure disorder     History reviewed. No pertinent family history.  SOCIAL HISTORY: History  Substance Use Topics  . Smoking status: Former Games developer  . Smokeless tobacco: Not on file  . Alcohol Use: No        REVIEW OF SYSTEMS: unable to obtain. The patient does not have strength really to speak.  PHYSICAL EXAM: Filed Vitals:   01/03/12 0858  BP: 140/80  Pulse: 76  Temp: 98.1 F (36.7 C)  TempSrc: Oral   There is no height or weight on file to calculate BMI. GENERAL: The patient is markedly debilitated male, in no acute distress. The vital signs are documented above. CARDIOVASCULAR: There is a regular rate and rhythm without significant murmur appreciated.  PULMONARY: There is good air exchange bilaterally without wheezing or rales. He has dry gangrene of his left heel without erythema or drainage. There is also a wound on the lateral  aspect of his right foot without erythema or drainage.  MEDICAL ISSUES: This patient is clearly not a candidate for limb salvage. If the wound in the left heel progresses he would require a left above-the-knee amputation. He would be at high risk even for this given his markedly debilitated state and severe protein malnutrition. I'll see him back in 3 months. The nursing home can call sooner if his foot progresses. No reason to consider amputation would be if the wound were making him septic.  Enoc Getter S [facility]: [phone]

## 2012-01-26 DEATH — deceased

## 2012-04-02 ENCOUNTER — Encounter: Payer: Self-pay | Admitting: Vascular Surgery

## 2012-04-03 ENCOUNTER — Ambulatory Visit: Payer: Medicare Other | Admitting: Vascular Surgery

## 2013-06-05 IMAGING — CR DG CHEST 2V
1 series · 1 of 1 positions shown · non-contrast
Comparison: 07/20/2011

CLINICAL DATA: Rectal bleeding

CHEST - 2 VIEW

[view not recorded]
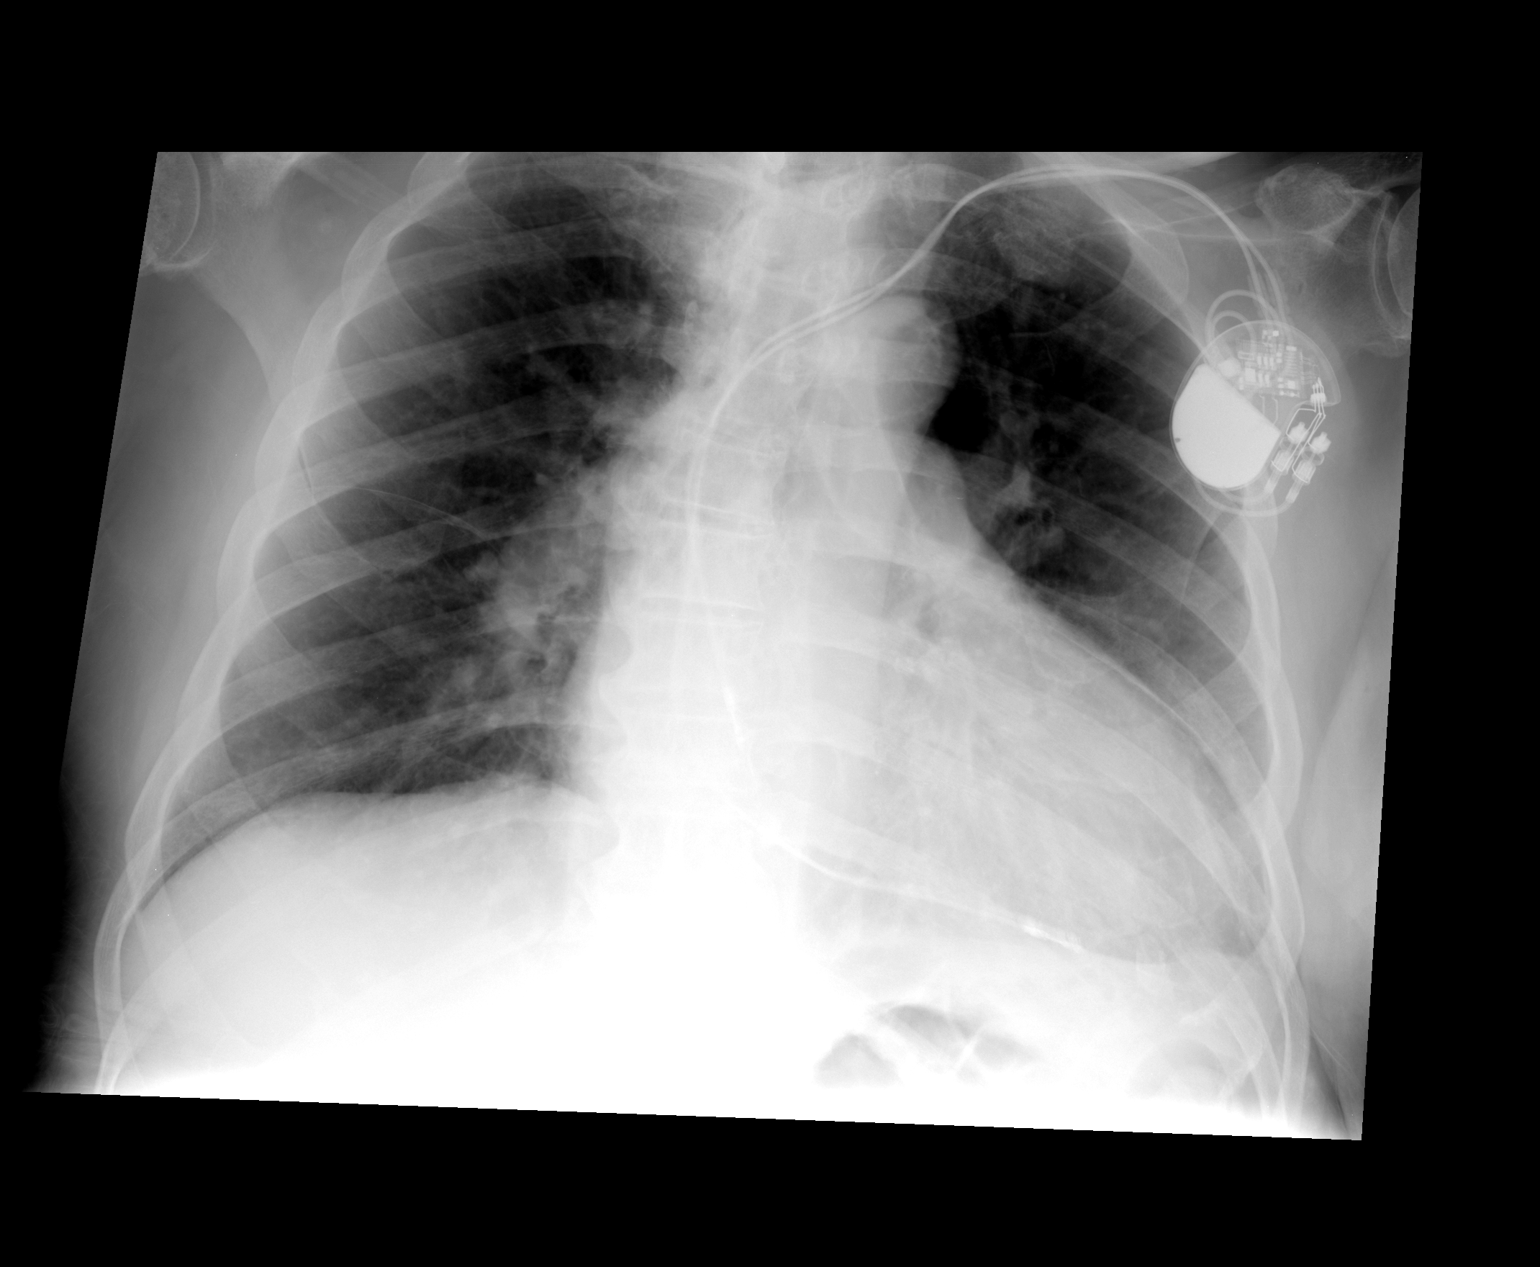

[1 of 1 positions shown; findings below may reference images not displayed]

FINDINGS: Pulmonary vascular congestion without frank interstitial
edema. No pleural effusion or pneumothorax.

Stable cardiomegaly.  Left subclavian dual lead pacemaker.

Degenerative changes of the visualized thoracolumbar spine.
IMPRESSION: Cardiomegaly with pulmonary vascular congestion.  No frank
interstitial edema.

## 2013-06-07 IMAGING — CR DG CHEST 1V PORT
1 series · 1 of 1 positions shown · non-contrast
Comparison: 09/20/2011

CLINICAL DATA: UTI and lethargy

PORTABLE CHEST - 1 VIEW

[view not recorded]
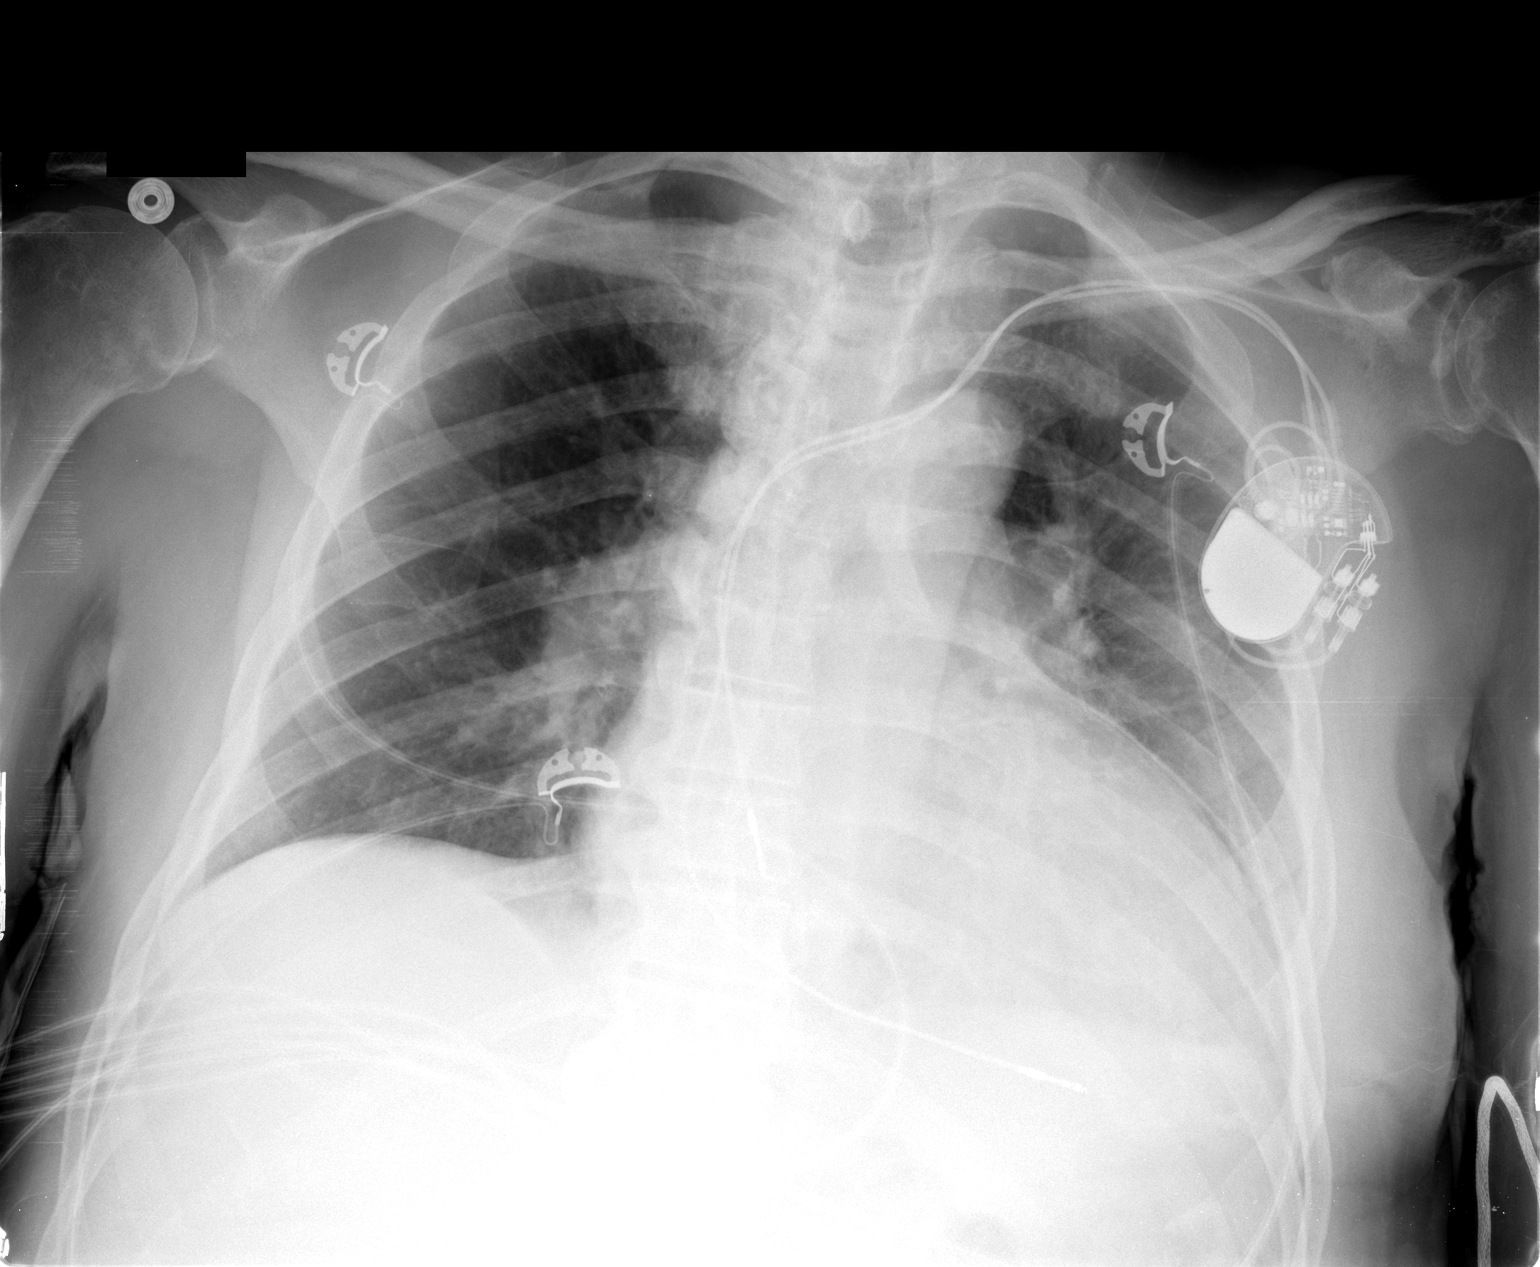

[1 of 1 positions shown; findings below may reference images not displayed]

FINDINGS: The heart is mildly enlarged.  Lungs are under aerated
and grossly clear.  Stable dual lead left subclavian pacemaker
device.  No pneumothorax and no pleural effusion.
IMPRESSION: Cardiomegaly without edema.

## 2013-07-05 IMAGING — CT CT L SPINE W/O CM
4 of 6 series · 13 of 33 positions shown, 15 images · non-contrast
Comparison: CT abdomen and pelvis 09/20/2011.

CLINICAL DATA: 84-year-old male with radiculopathy, possible lumbar
stenosis.

CT LUMBAR SPINE WITHOUT CONTRAST
TECHNIQUE: Multidetector CT imaging of the lumbar spine was
performed without intravenous contrast administration. Multiplanar
CT image reconstructions were also generated.

[Series 5: t/l_spine 2.0 b31s st · axial · 0.43mm/px · z∈[-250,-130]mm · 3 of 121 slices shown, 4 images]
[im 31/121  soft-tissue]
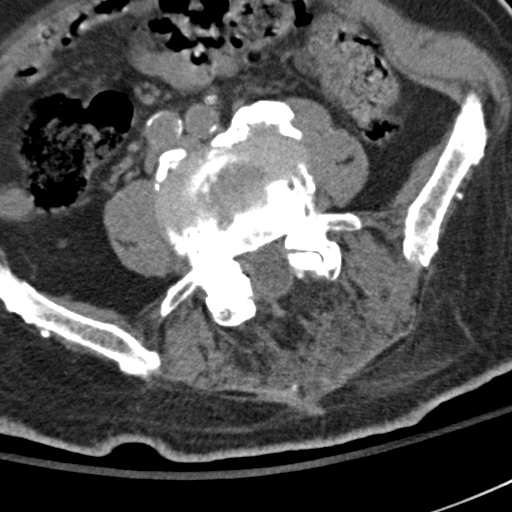
[im 31/121  bone]
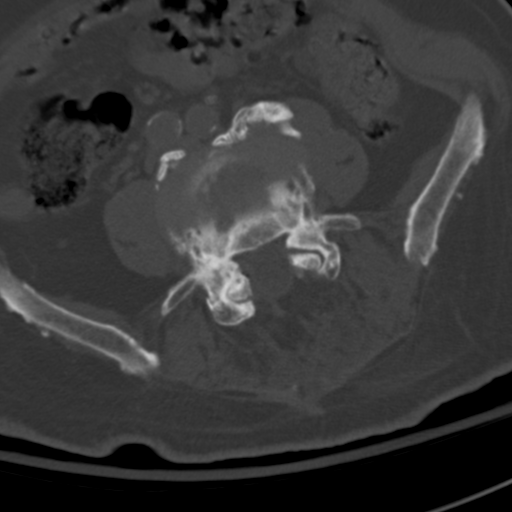
[im 61/121  bone]
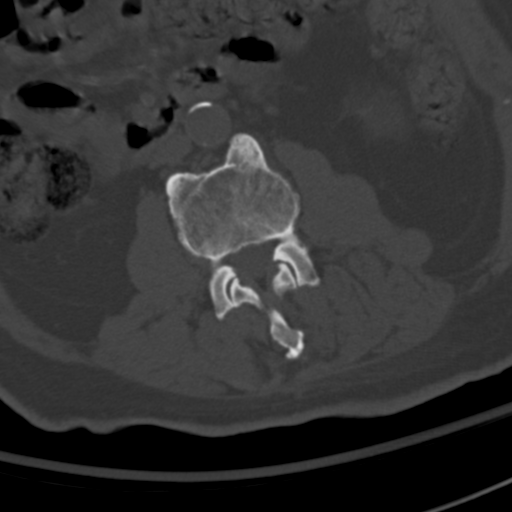
[im 91/121  bone]
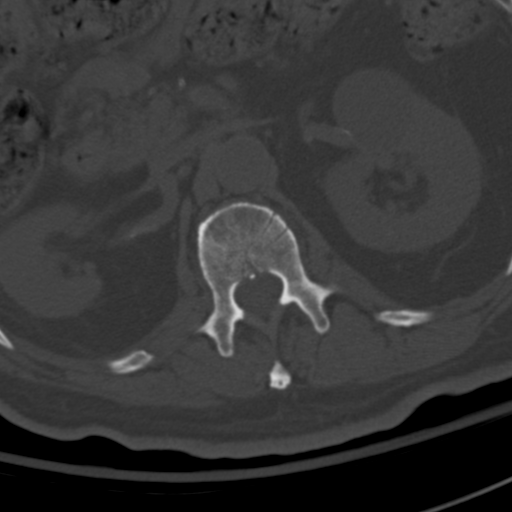

[Series 602: st axial · axial · 0.47mm/px · z∈[-199,-126]mm · 2 of 113 slices shown]
[im 38/113  bone]
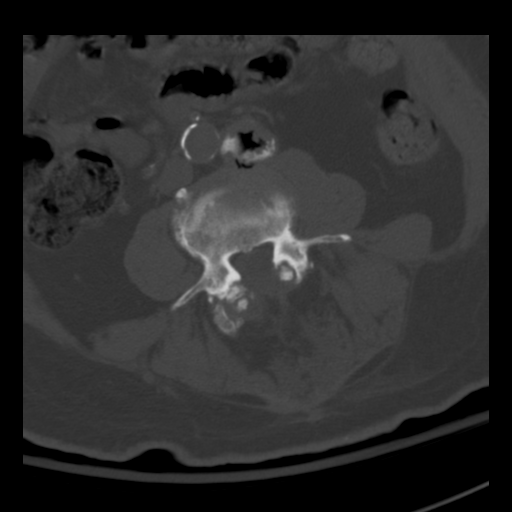
[im 75/113  bone]
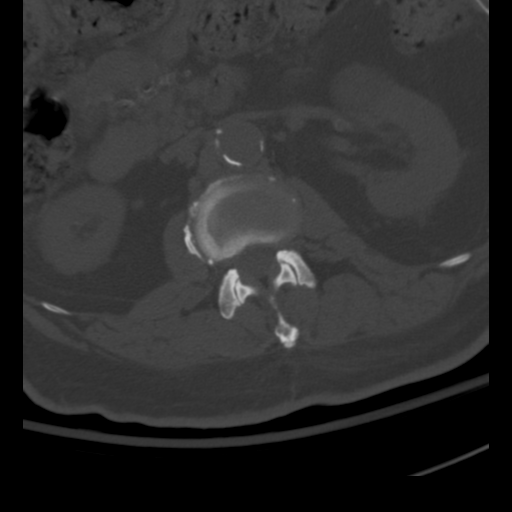

[Series 604: sagittals · sagittal · 0.47mm/px · 5 of 65 slices shown, 6 images]
[im 22/65  bone]
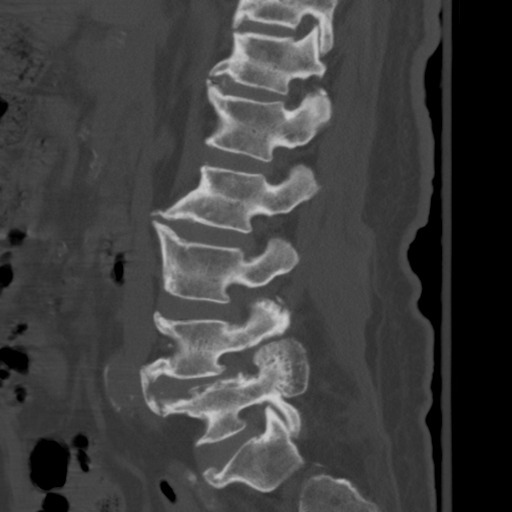
[im 27/65  bone]
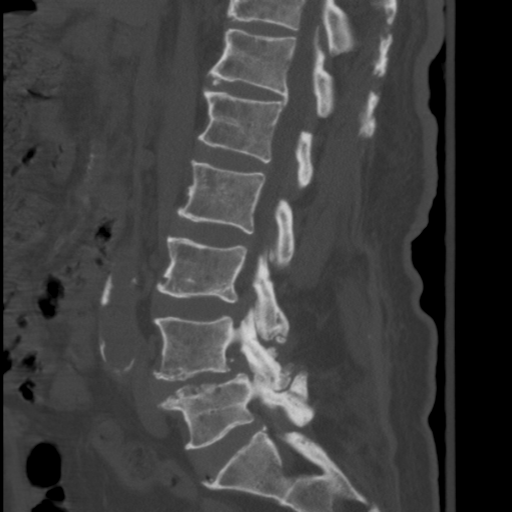
[im 33/65  soft-tissue]
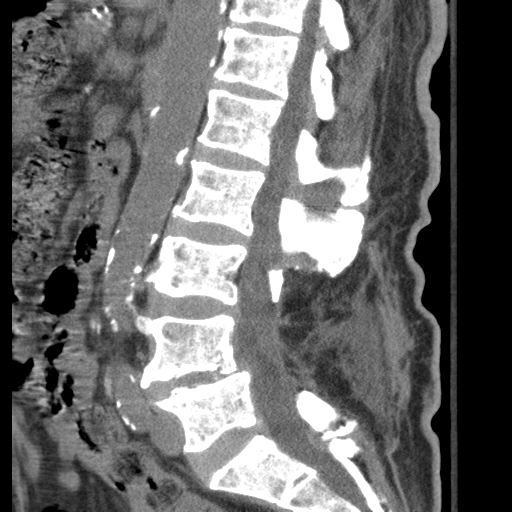
[im 33/65  bone]
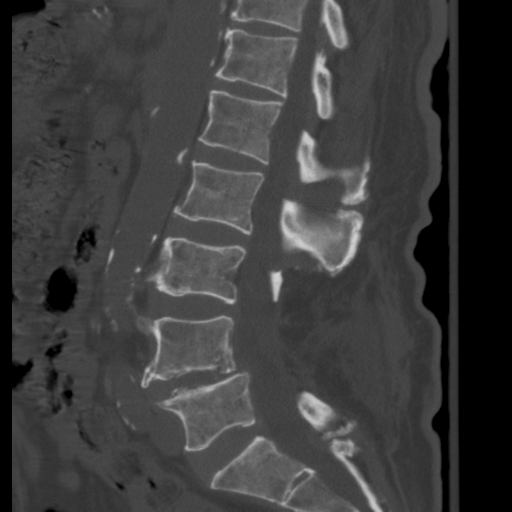
[im 38/65  bone]
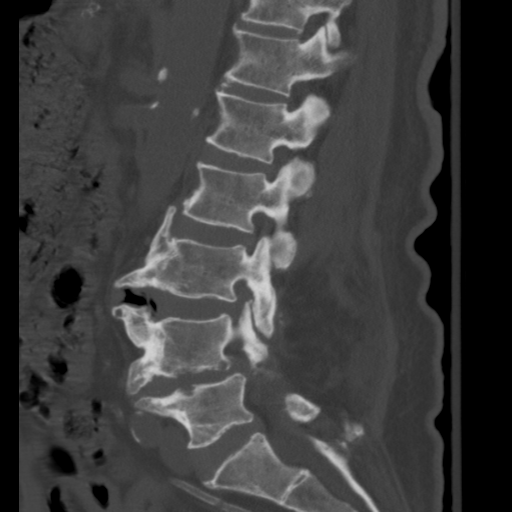
[im 43/65  bone]
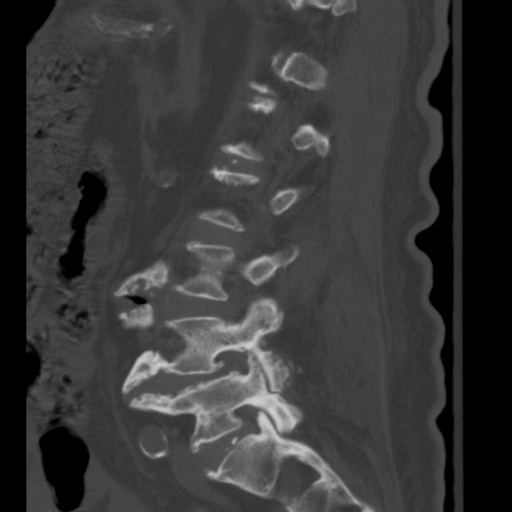

[Series 605: bone coronals · coronal · 0.47mm/px · 3 of 60 slices shown]
[im 12/60  bone]
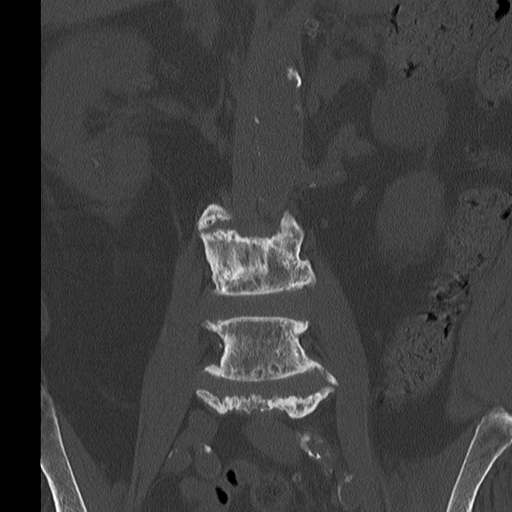
[im 24/60  bone]
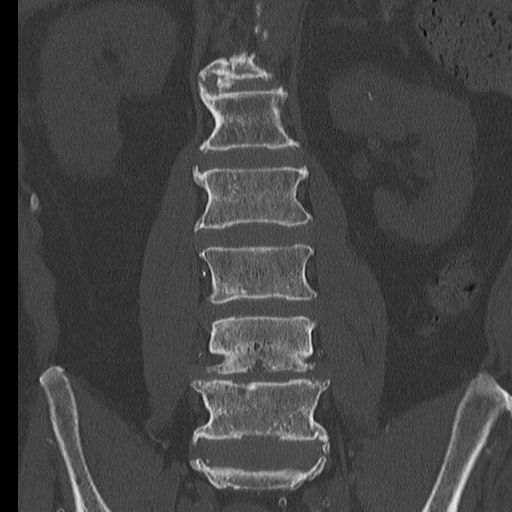
[im 36/60  bone]
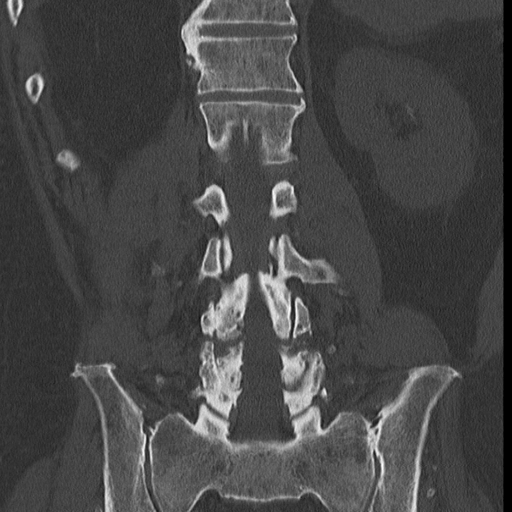

[13 of 33 positions shown; findings below may reference images not displayed]

FINDINGS: Cardiac pacemaker leads partially visualized.  Stable
visualized abdominal viscera including extensive calcified
atherosclerosis, exophytic left renal cyst, and there has been some
increase in the volume of retained stool in the colon.

Prior surgery to the L3-L4 and L4-L5 levels, see details below.
Bone mineralization is within normal limits for age.  Degenerative
ankylosis of the left SI joint.  Visualized sacrum is intact.

T11-T12:  Right far lateral disc osteophyte complex.  Mild ligament
flavum hypertrophy.  No spinal or foraminal stenosis.

T12-L1:  Mild calcified circumferential disc bulge.  No spinal or
foraminal stenosis.

L1-L2:  Mild disc bulge.  Moderate facet hypertrophy greater on the
right.  Bilateral vacuum facet phenomena.  No spinal stenosis.
Mild right L1 foraminal stenosis.

L2-L3:  Mild to moderate circumferential disc bulge.  Moderate
facet hypertrophy.  Vacuum facet phenomenon greater on the left.
No spinal stenosis.  Mild bilateral L2 foraminal stenosis.

L3-L4:  Sequelae of decompression.  Severe residual facet
hypertrophy. Vacuum facet hypertrophy on the left.  Circumferential
disc bulge.  Ligament flavum hypertrophy greater on the right.  No
spinal stenosis, but a degree of bilateral lateral recess stenosis
is suspected.  There is also severe left and moderate right
bilateral L3 foraminal stenosis.

L4-L5:  Sequelae of decompression.  Anterolisthesis measuring 4-5
mm.  Very severe residual facet hypertrophy.  Circumferential disc
osteophyte complex.  No spinal stenosis, but there could be a
bilateral lateral recess stenosis.  There is severe bilateral L4
foraminal stenosis, worse on the left.

L5-S1:  Circumferential disc bulge.  Mild facet hypertrophy.  No
spinal stenosis.  Moderate left and severe right L5 foraminal
stenosis.
IMPRESSION: 1.  Previous decompression at L3-L4 and L4-L5.

- At L4-L5 there is grade 1 spondylolisthesis with very severe
residual facet hypertrophy and multifactorial severe bilateral L4
foraminal stenosis and possible lateral recess stenosis.
- At L3-L4 there is residual facet is and degeneration with
moderate to severe bilateral L3 foraminal stenosis and possible
lateral recess stenosis.
2. Moderate to severe multifactorial bilateral L5 foraminal
stenosis.
3.  No lumbar spinal stenosis.  Comparatively mild for age disc and
facet degeneration elsewhere in the lumbar spine.

## 2013-12-15 ENCOUNTER — Encounter: Payer: Self-pay | Admitting: Internal Medicine

## 2014-01-09 ENCOUNTER — Telehealth: Payer: Self-pay | Admitting: Internal Medicine

## 2014-01-09 NOTE — Telephone Encounter (Signed)
Per pt's dtr, pt passed sway in 2013/mt

## 2014-12-02 ENCOUNTER — Encounter (HOSPITAL_COMMUNITY): Payer: Self-pay | Admitting: Vascular Surgery
